# Patient Record
Sex: Male | Born: 1970 | Race: White | Hispanic: No | Smoking: Former smoker
Health system: Southern US, Community
[De-identification: ages and names within clinical notes are randomized; demographics above are authoritative.]

## PROBLEM LIST (undated history)

## (undated) DIAGNOSIS — K219 Gastro-esophageal reflux disease without esophagitis: Secondary | ICD-10-CM

## (undated) DIAGNOSIS — J45909 Unspecified asthma, uncomplicated: Secondary | ICD-10-CM

## (undated) HISTORY — DX: Unspecified asthma, uncomplicated: J45.909

## (undated) HISTORY — DX: Gastro-esophageal reflux disease without esophagitis: K21.9

## (undated) HISTORY — PX: OTHER SURGICAL HISTORY: SHX169

---

## 2009-04-18 ENCOUNTER — Emergency Department (HOSPITAL_COMMUNITY): Admission: EM | Admit: 2009-04-18 | Discharge: 2009-04-18 | Payer: Self-pay | Admitting: *Deleted

## 2012-05-30 ENCOUNTER — Emergency Department (HOSPITAL_BASED_OUTPATIENT_CLINIC_OR_DEPARTMENT_OTHER)
Admission: EM | Admit: 2012-05-30 | Discharge: 2012-05-30 | Disposition: A | Discharge status: Home / Self Care | Payer: BC Managed Care – PPO | Attending: Emergency Medicine | Admitting: Emergency Medicine

## 2012-05-30 ENCOUNTER — Encounter (HOSPITAL_BASED_OUTPATIENT_CLINIC_OR_DEPARTMENT_OTHER): Payer: Self-pay | Admitting: *Deleted

## 2012-05-30 DIAGNOSIS — X12XXXA Contact with other hot fluids, initial encounter: Secondary | ICD-10-CM | POA: Insufficient documentation

## 2012-05-30 DIAGNOSIS — Z880 Allergy status to penicillin: Secondary | ICD-10-CM | POA: Insufficient documentation

## 2012-05-30 DIAGNOSIS — T23202A Burn of second degree of left hand, unspecified site, initial encounter: Secondary | ICD-10-CM

## 2012-05-30 DIAGNOSIS — T2112XA Burn of first degree of abdominal wall, initial encounter: Secondary | ICD-10-CM | POA: Insufficient documentation

## 2012-05-30 DIAGNOSIS — T23209A Burn of second degree of unspecified hand, unspecified site, initial encounter: Secondary | ICD-10-CM | POA: Insufficient documentation

## 2012-05-30 DIAGNOSIS — F17200 Nicotine dependence, unspecified, uncomplicated: Secondary | ICD-10-CM | POA: Insufficient documentation

## 2012-05-30 MED ORDER — OXYCODONE-ACETAMINOPHEN 5-325 MG PO TABS: 2.0000 | ORAL_TABLET | Freq: Four times a day (QID) | ORAL | Status: AC | PRN

## 2012-05-30 MED ORDER — OXYCODONE-ACETAMINOPHEN 5-325 MG PO TABS: 2.0000 | ORAL_TABLET | Freq: Once | ORAL | Status: DC

## 2012-05-30 MED ORDER — MORPHINE SULFATE 4 MG/ML IJ SOLN
4.0000 mg | Freq: Once | INTRAMUSCULAR | Status: DC
  Filled 2012-05-30: qty 1

## 2012-05-30 MED ORDER — SILVER SULFADIAZINE 1 % EX CREA: TOPICAL_CREAM | Freq: Two times a day (BID) | CUTANEOUS | Status: AC

## 2012-05-30 MED ORDER — MORPHINE SULFATE 4 MG/ML IJ SOLN
4.0000 mg | Freq: Once | INTRAMUSCULAR | Status: AC
  Administered 2012-05-30: 4 mg via INTRAMUSCULAR

## 2012-05-30 MED ORDER — MORPHINE SULFATE 4 MG/ML IJ SOLN
4.0000 mg | Freq: Once | INTRAMUSCULAR | Status: AC
  Administered 2012-05-30: 4 mg via INTRAMUSCULAR
  Filled 2012-05-30: qty 1

## 2012-05-30 MED ORDER — SILVER SULFADIAZINE 1 % EX CREA
TOPICAL_CREAM | CUTANEOUS | Status: AC
  Administered 2012-05-30: 22:00:00
  Filled 2012-05-30: qty 85

## 2012-05-30 MED ORDER — OXYCODONE-ACETAMINOPHEN 5-325 MG PO TABS
2.0000 | ORAL_TABLET | Freq: Once | ORAL | Status: AC
  Administered 2012-05-30: 2 via ORAL
  Filled 2012-05-30: qty 2

## 2012-05-30 MED ORDER — LIDOCAINE HCL 2 % EX GEL
Freq: Once | CUTANEOUS | Status: DC
  Filled 2012-05-30: qty 20

## 2012-05-30 NOTE — ED Provider Notes (Signed)
I saw and evaluated the patient, reviewed the resident's note and I agree with the findings and plan.   .Face to face Exam:  General:  Awake HEENT:  Atraumatic Resp:  Normal effort Abd:  Nondistended Neuro:No focal weakness Lymph: No adenopathy   Nelia Shi, MD 05/30/12 2203
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
## 2012-06-01 ENCOUNTER — Encounter (HOSPITAL_BASED_OUTPATIENT_CLINIC_OR_DEPARTMENT_OTHER): Payer: BC Managed Care – PPO | Attending: General Surgery

## 2012-06-01 DIAGNOSIS — IMO0002 Reserved for concepts with insufficient information to code with codable children: Secondary | ICD-10-CM | POA: Insufficient documentation

## 2012-06-01 DIAGNOSIS — T310 Burns involving less than 10% of body surface: Secondary | ICD-10-CM | POA: Insufficient documentation

## 2012-06-01 DIAGNOSIS — T23039A Burn of unspecified degree of unspecified multiple fingers (nail), not including thumb, initial encounter: Secondary | ICD-10-CM | POA: Insufficient documentation
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
## 2012-06-08 ENCOUNTER — Encounter (HOSPITAL_BASED_OUTPATIENT_CLINIC_OR_DEPARTMENT_OTHER): Payer: BC Managed Care – PPO

## 2012-06-29 ENCOUNTER — Encounter (HOSPITAL_BASED_OUTPATIENT_CLINIC_OR_DEPARTMENT_OTHER): Payer: BC Managed Care – PPO

## 2017-04-06 ENCOUNTER — Other Ambulatory Visit: Payer: Self-pay | Admitting: Medical

## 2017-04-06 DIAGNOSIS — R1011 Right upper quadrant pain: Secondary | ICD-10-CM

## 2017-04-13 ENCOUNTER — Ambulatory Visit
Admission: RE | Admit: 2017-04-13 | Discharge: 2017-04-13 | Disposition: A | Discharge status: Home / Self Care | Payer: No Typology Code available for payment source | Source: Ambulatory Visit | Attending: Medical | Admitting: Medical

## 2017-04-13 DIAGNOSIS — R1011 Right upper quadrant pain: Secondary | ICD-10-CM

## 2019-08-26 ENCOUNTER — Encounter (HOSPITAL_BASED_OUTPATIENT_CLINIC_OR_DEPARTMENT_OTHER): Payer: Self-pay | Admitting: *Deleted

## 2019-08-26 ENCOUNTER — Other Ambulatory Visit: Payer: Self-pay

## 2019-08-26 ENCOUNTER — Emergency Department (HOSPITAL_BASED_OUTPATIENT_CLINIC_OR_DEPARTMENT_OTHER): Payer: Self-pay

## 2019-08-26 ENCOUNTER — Emergency Department (HOSPITAL_BASED_OUTPATIENT_CLINIC_OR_DEPARTMENT_OTHER)
Admission: EM | Admit: 2019-08-26 | Discharge: 2019-08-26 | Disposition: A | Discharge status: Home / Self Care | Payer: Self-pay | Attending: Emergency Medicine | Admitting: Emergency Medicine

## 2019-08-26 DIAGNOSIS — F17210 Nicotine dependence, cigarettes, uncomplicated: Secondary | ICD-10-CM | POA: Insufficient documentation

## 2019-08-26 DIAGNOSIS — W050XXA Fall from non-moving wheelchair, initial encounter: Secondary | ICD-10-CM | POA: Insufficient documentation

## 2019-08-26 DIAGNOSIS — Y999 Unspecified external cause status: Secondary | ICD-10-CM | POA: Insufficient documentation

## 2019-08-26 DIAGNOSIS — Y929 Unspecified place or not applicable: Secondary | ICD-10-CM | POA: Insufficient documentation

## 2019-08-26 DIAGNOSIS — S2241XA Multiple fractures of ribs, right side, initial encounter for closed fracture: Secondary | ICD-10-CM | POA: Insufficient documentation

## 2019-08-26 DIAGNOSIS — Y939 Activity, unspecified: Secondary | ICD-10-CM | POA: Insufficient documentation

## 2019-08-26 MED ORDER — OXYCODONE-ACETAMINOPHEN 5-325 MG PO TABS
1.0000 | ORAL_TABLET | Freq: Once | ORAL | Status: AC
  Administered 2019-08-26: 1 via ORAL
  Filled 2019-08-26: qty 1

## 2019-08-26 MED ORDER — OXYCODONE-ACETAMINOPHEN 5-325 MG PO TABS: 1.0000 | ORAL_TABLET | Freq: Three times a day (TID) | ORAL | 0 refills | Status: DC | PRN

## 2019-08-26 NOTE — Discharge Instructions (Signed)
It is important for you to have your pain control and use incentive spirometry as directed. Follow-up with your primary care provider. Return to the ED if you start to develop worsening chest pain, shortness of breath, leg swelling, fever.
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
## 2020-01-10 ENCOUNTER — Other Ambulatory Visit: Payer: Self-pay

## 2020-01-11 MED ORDER — LANSOPRAZOLE 30 MG PO CPDR: 30.0000 mg | DELAYED_RELEASE_CAPSULE | Freq: Every day | ORAL | 0 refills | Status: DC

## 2020-01-16 ENCOUNTER — Ambulatory Visit (INDEPENDENT_AMBULATORY_CARE_PROVIDER_SITE_OTHER): Payer: Self-pay | Admitting: Physician Assistant

## 2020-01-16 ENCOUNTER — Encounter: Payer: Self-pay | Admitting: Physician Assistant

## 2020-01-16 ENCOUNTER — Other Ambulatory Visit: Payer: Self-pay

## 2020-01-16 VITALS — BP 128/72 | HR 80 | Temp 98.2°F | Resp 18 | Ht 71.0 in | Wt 251.0 lb

## 2020-01-16 DIAGNOSIS — K219 Gastro-esophageal reflux disease without esophagitis: Secondary | ICD-10-CM | POA: Insufficient documentation

## 2020-01-16 MED ORDER — LANSOPRAZOLE 30 MG PO CPDR: 30.0000 mg | DELAYED_RELEASE_CAPSULE | Freq: Every day | ORAL | 11 refills | Status: DC

## 2020-01-16 NOTE — Progress Notes (Signed)
Acute Office Visit  Subjective:    Patient ID: John Valencia, male    DOB: January 17, 1971, 49 y.o.   MRN: [id number]  Chief Complaint  Patient presents with  . Gastroesophageal Reflux    Follow up for refill meds    HPI Patient is in today for follow up GERD Pt has been on prevacid 30mg  qd for several years - he has tried many medications in the past with minimal to no relief and this one takes care of his symptoms completely  He requests refill He denies any other problems today -- have recommended physical and labwork but pt defers due to financial reasons  Past Medical History:  Diagnosis Date  . Asthma   . GERD (gastroesophageal reflux disease)     Past Surgical History:  Procedure Laterality Date  . gerd      Family History  Problem Relation Age of Onset  . Cerebrovascular Accident Maternal Grandmother   . Diabetes type II Other   . Cancer Other     Social History   Socioeconomic History  . Marital status: Married    Spouse name: Not on file  . Number of children: 3  . Years of education: Not on file  . Highest education level: Not on file  Occupational History  . Occupation: Self employed  Tobacco Use  . Smoking status: Former Smoker    Packs/day: 1.00  . Smokeless tobacco: Never Used  Substance and Sexual Activity  . Alcohol use: No  . Drug use: No  . Sexual activity: Never  Other Topics Concern  . Not on file  Social History Narrative  . Not on file   Social Determinants of Health   Financial Resource Strain:   . Difficulty of Paying Living Expenses:   Food Insecurity:   . Worried About Medical laboratory scientific officer in the Last Year:   . Programme researcher, broadcasting/film/video in the Last Year:   Transportation Needs:   . Barista (Medical):   Freight forwarder Lack of Transportation (Non-Medical):   Physical Activity:   . Days of Exercise per Week:   . Minutes of Exercise per Session:   Stress:   . Feeling of Stress :   Social Connections:   . Frequency of  Communication with Friends and Family:   . Frequency of Social Gatherings with Friends and Family:   . Attends Religious Services:   . Active Member of Clubs or Organizations:   . Attends Marland Kitchen Meetings:   Banker Marital Status:   Intimate Partner Violence:   . Fear of Current or Ex-Partner:   . Emotionally Abused:   Marland Kitchen Physically Abused:   . Sexually Abused:      Current Outpatient Medications:  .  Aspirin-Acetaminophen-Caffeine (EXCEDRIN MIGRAINE PO), Take by mouth., Disp: , Rfl:  .  lansoprazole (PREVACID) 30 MG capsule, Take 1 capsule (30 mg total) by mouth daily., Disp: 301 capsule, Rfl: 11   Allergies  Allergen Reactions  . Penicillins Anaphylaxis    CONSTITUTIONAL: Negative for chills, fatigue, fever, unintentional weight gain and unintentional weight loss.  E/N/T: Negative for ear pain, nasal congestion and sore throat.  CARDIOVASCULAR: Negative for chest pain, dizziness, palpitations and pedal edema.  RESPIRATORY: Negative for recent cough and dyspnea.  GASTROINTESTINAL: Negative for abdominal pain, acid reflux symptoms, constipation, diarrhea, nausea and vomiting.  MSK: Negative for arthralgias and myalgias.  INTEGUMENTARY: Negative for rash.  NEUROLOGICAL: Negative for dizziness and headaches.  PSYCHIATRIC: Negative for  sleep disturbance and to question depression screen.  Negative for depression, negative for anhedonia.         Objective:    PHYSICAL EXAM:   VS: BP 128/72 (BP Location: Left Arm, Patient Position: Sitting)   Pulse 80   Temp 98.2 F (36.8 C) (Temporal)   Resp 18   Ht 5\' 11"  (1.803 m)   Wt 251 lb (113.9 kg)   BMI 35.01 kg/m   GEN: Well nourished, well developed, in no acute distress   Oropharynx - normal mucosa, palate, and posterior pharynx  Cardiac: RRR; no murmurs, rubs, or gallops,no edema - no significant varicosities Respiratory:  normal respiratory rate and pattern with no distress - normal breath sounds with no rales,  rhonchi, wheezes or rubs GI: normal bowel sounds, no masses or tenderness  Psych: euthymic mood, appropriate affect and demeanor   Wt Readings from Last 3 Encounters:  01/16/20 251 lb (113.9 kg)  08/26/19 265 lb (120.2 kg)  05/30/12 202 lb (91.6 kg)    Health Maintenance Due  Topic Date Due  . HIV Screening  Never done  . TETANUS/TDAP  Never done  . INFLUENZA VACCINE  Never done    There are no preventive care reminders to display for this patient.        Assessment & Plan:   Problem List Items Addressed This Visit      Digestive   Gastroesophageal reflux disease without esophagitis - Primary    Continue prevacid as directed Recommend pt come in for physical and labwork      Relevant Medications   lansoprazole (PREVACID) 30 MG capsule       Meds ordered this encounter  Medications  . lansoprazole (PREVACID) 30 MG capsule    Sig: Take 1 capsule (30 mg total) by mouth daily.    Dispense:  301 capsule    Refill:  11    Order Specific Question:   Supervising Provider    Answer:   COX, KIRSTEN [[id number]]     [place], PA-C

## 2020-01-16 NOTE — Assessment & Plan Note (Signed)
Continue prevacid as directed Recommend pt come in for physical and labwork

## 2020-03-13 LAB — COMPREHENSIVE METABOLIC PANEL
Albumin: 4.2 (ref 3.5–5.0)
Calcium: 8.9 (ref 8.7–10.7)
GFR calc Af Amer: 95
GFR calc non Af Amer: 82
Globulin: 2.3

## 2020-03-13 LAB — BASIC METABOLIC PANEL
BUN: 11 (ref 4–21)
CO2: 26 — AB (ref 13–22)
Chloride: 103 (ref 99–108)
Creatinine: 1.1 (ref 0.6–1.3)
Glucose: 135
Potassium: 4.2 (ref 3.4–5.3)
Sodium: 141 (ref 137–147)

## 2020-03-13 LAB — HEPATIC FUNCTION PANEL
ALT: 96 — AB (ref 10–40)
AST: 43 — AB (ref 14–40)
Alkaline Phosphatase: 54 (ref 25–125)
Bilirubin, Total: 0.4

## 2020-04-13 ENCOUNTER — Other Ambulatory Visit: Payer: Self-pay

## 2020-04-13 MED ORDER — LANSOPRAZOLE 30 MG PO CPDR: 30.0000 mg | DELAYED_RELEASE_CAPSULE | Freq: Every day | ORAL | 1 refills | Status: DC

## 2020-09-21 ENCOUNTER — Other Ambulatory Visit: Payer: Self-pay

## 2020-09-21 ENCOUNTER — Ambulatory Visit (INDEPENDENT_AMBULATORY_CARE_PROVIDER_SITE_OTHER): Payer: Self-pay | Admitting: Family Medicine

## 2020-09-21 ENCOUNTER — Encounter: Payer: Self-pay | Admitting: Family Medicine

## 2020-09-21 VITALS — BP 128/76 | HR 63 | Temp 97.7°F | Ht 71.0 in | Wt 237.2 lb

## 2020-09-21 DIAGNOSIS — M79605 Pain in left leg: Secondary | ICD-10-CM

## 2020-09-21 DIAGNOSIS — K219 Gastro-esophageal reflux disease without esophagitis: Secondary | ICD-10-CM

## 2020-09-21 DIAGNOSIS — R0789 Other chest pain: Secondary | ICD-10-CM | POA: Insufficient documentation

## 2020-09-21 DIAGNOSIS — M79604 Pain in right leg: Secondary | ICD-10-CM | POA: Insufficient documentation

## 2020-09-21 DIAGNOSIS — R7309 Other abnormal glucose: Secondary | ICD-10-CM | POA: Insufficient documentation

## 2020-09-21 NOTE — Progress Notes (Signed)
Established Patient Office Visit  Subjective:  Patient ID: John Valencia, male    DOB: [birth year]  Age: 49 y.o. MRN: [id number]  CC:  Chest pain  Leg pain bilat-spasms HPI John Valencia presents for chest pain-right upper quad pain, left upper quad pain-better with stretching. Gallbladder u/s-no concerns-sluge. Pt states leg cramps-"always been strong" calf pain-bilat caused pt and family concern. pts wife died recently of COVID pneumonia. Pt is a truck driver and rarely drinks water. Pt states he does not eat while driving except chips as large amounts of food makes him sleepy. Pt reports on Sat-deer hunting. Onset of leg cramps. Pt states it was cold outside but he often hunts in cooler weather and knows how to layer.  Pt drinks coffee/soda-rarely a soda.  Tea-1/2 gallon /day.  Pt goes deer hunting every weekend.  pts kids concerned about his health. Difficult time due to death of mother.    GERD-prevaid/pepcid prevaid-works on pt GERD-takes daily  Asthma-no inhaler on a regular basis-trigger exercise-no use of inhaler over the past few yearls. Pt quit smoking 3 years ago 11/2017- quit smoking-no meds.   Leg pain-bilat noted with deer hunting described as spasms with flex/extend. Pt states no swelling, no color change noted on the legs. Pt with no h/o DVT or PE. Pt reports walking to deer stand. Stays in a camper as basecamp. Pt admits little water or food on day prior to spasm. Legs yesterday achy but no spasm. Improved today  Stress test-6-7 years ago-treadmill -difficulty due to smoking with SOB, no CP  Pt not willing to check stool or complete colonoscopy   Past Medical History:  Diagnosis Date  . Asthma   . GERD (gastroesophageal reflux disease)     Past Surgical History:  Procedure Laterality Date  . gerd      Family History  Problem Relation Age of Onset  . Cerebrovascular Accident Maternal Grandmother   . Diabetes type II Other   . Cancer Other     Social  History   Socioeconomic History  . Marital status: Married    Spouse name: Not on file  . Number of children: 3  . Years of education: Not on file  . Highest education level: Not on file  Occupational History  . Occupation: Self employedPatent attorney  Tobacco Use  . Smoking status: Former Smoker    Packs/day: 1.00  . Smokeless tobacco: Never Used  Vaping Use  . Vaping Use: Never used  Substance and Sexual Activity  . Alcohol use: No  . Drug use: No  . Sexual activity: Never  Other Topics Concern  . Not on file  Social History Narrative  . Not on file   Social Determinants of Health   Financial Resource Strain:   . Difficulty of Paying Living Expenses: Not on file  Food Insecurity:   . Worried About Charity fundraiser in the Last Year: Not on file  . Ran Out of Food in the Last Year: Not on file  Transportation Needs:   . Lack of Transportation (Medical): Not on file  . Lack of Transportation (Non-Medical): Not on file  Physical Activity:   . Days of Exercise per Week: Not on file  . Minutes of Exercise per Session: Not on file  Stress:   . Feeling of Stress : Not on file  Social Connections:   . Frequency of Communication with Friends and Family: Not on file  . Frequency of Social Gatherings with Friends  and Family: Not on file  . Attends Religious Services: Not on file  . Active Member of Clubs or Organizations: Not on file  . Attends Archivist Meetings: Not on file  . Marital Status: Not on file  Intimate Partner Violence:   . Fear of Current or Ex-Partner: Not on file  . Emotionally Abused: Not on file  . Physically Abused: Not on file  . Sexually Abused: Not on file    Outpatient Medications Prior to Visit  Medication Sig Dispense Refill  . Aspirin-Acetaminophen-Caffeine (EXCEDRIN MIGRAINE PO) Take by mouth.    . lansoprazole (PREVACID) 30 MG capsule Take 1 capsule (30 mg total) by mouth daily. 90 capsule 1   No facility-administered medications  prior to visit.    Allergies  Allergen Reactions  . Penicillins Anaphylaxis    ROS Review of Systems  Constitutional: Negative for fatigue and fever.  Respiratory: Negative for cough, chest tightness and shortness of breath.   Cardiovascular: Positive for chest pain.  Gastrointestinal: Positive for abdominal pain.  Endocrine:       Elevated glucose  Musculoskeletal:       Leg spasms  Skin: Negative for color change and rash.      Objective:    Physical Exam Vitals reviewed.  Constitutional:      Appearance: Normal appearance.  HENT:     Head: Normocephalic and atraumatic.  Cardiovascular:     Rate and Rhythm: Normal rate and regular rhythm.     Pulses: Normal pulses.     Heart sounds: Normal heart sounds.  Pulmonary:     Effort: Pulmonary effort is normal.     Breath sounds: Normal breath sounds.  Abdominal:     Palpations: Abdomen is soft.  Musculoskeletal:        General: No swelling or tenderness.     Cervical back: Normal range of motion and neck supple.     Right lower leg: No edema.     Left lower leg: No edema.     Comments: No asymmetry to lower legs when measured  Skin:    General: Skin is warm.     Findings: No bruising or erythema.  Neurological:     Mental Status: He is alert and oriented to person, place, and time.  Psychiatric:        Mood and Affect: Mood normal.        Behavior: Behavior normal.     BP 128/76 (BP Location: Left Arm, Patient Position: Sitting, Cuff Size: Normal)   Pulse 63   Temp 97.7 F (36.5 C) (Temporal)   Ht _0  (1.803 m)   Wt 237 lb 3.2 oz (107.6 kg)   SpO2 96%   BMI 33.08 kg/m  Wt Readings from Last 3 Encounters:  09/21/20 237 lb 3.2 oz (107.6 kg)  01/16/20 251 lb (113.9 kg)  08/26/19 265 lb (120.2 kg)  weight loss-220lbs avg-when pt quit smoking-gained weight. Pt cooked Thanksgiving yesterday.  Pt eats alone. Pt does not eat while he drives. "munch on chips"    Health Maintenance Due  Topic Date Due  .  Hepatitis C Screening  Never done  . COVID-19 Vaccine (1) Never done  . HIV Screening  Never done  . TETANUS/TDAP  Never done  . COLONOSCOPY  Never done   1. Leg pain, bilateral Electrolyte water suggested today. Protein/Carb combo with meals/snacks-concern for elevated glucose on labwork 2019 - CMP14+EGFR - CBC With Differential - Lipid Profile - TSH + free  T4  2. Gastroesophageal reflux disease, unspecified whether esophagitis present Continue prevacid-concern for caffeine and fluid loss-smaller meals + no caffeine fluids to improve hydration - CMP14+EGFR - CBC With Differential - Lipid Profile - TSH + free T4 - CBC with Differential/Platelet - Magnesium 3. Elevated glucose Previously elevated on blood work-pt did not return for follow up - Hgb A1C w/o eAG  4. Other chest pain ecg normal-prior stress test. Bp, pulse rate and ecg normal. D/w pt if worsening symptoms, recurrent symptoms or other concerns ER for evaluation.  Follow-up labwork  Corderius Saraceni Hannah Beat, MD

## 2020-09-21 NOTE — Patient Instructions (Signed)
Leg Cramps Leg cramps occur when one or more muscles tighten and you have no control over this tightening (involuntary muscle contraction). Muscle cramps can develop in any muscle, but the most common place is in the calf muscles of the leg. Those cramps can occur during exercise or when you are at rest. Leg cramps are painful, and they may last for a few seconds to a few minutes. Cramps may return several times before they finally stop. Usually, leg cramps are not caused by a serious medical problem. In many cases, the cause is not known. Some common causes include:  Excessive physical effort (overexertion), such as during intense exercise.  Overuse from repetitive motions, or doing the same thing over and over.  Staying in a certain position for a long period of time.  Improper preparation, form, or technique while performing a sport or an activity.  Dehydration.  Injury.  Side effects of certain medicines.  Abnormally low levels of minerals in your blood (electrolytes), especially potassium and calcium. This could result from: ? Pregnancy. ? Taking diuretic medicines. Follow these instructions at home: Eating and drinking  Drink enough fluid to keep your urine pale yellow. Staying hydrated may help prevent cramps.  Eat a healthy diet that includes plenty of nutrients to help your muscles function. A healthy diet includes fruits and vegetables, lean protein, whole grains, and low-fat or nonfat dairy products. Managing pain, stiffness, and swelling      Try massaging, stretching, and relaxing the affected muscle. Do this for several minutes at a time.  If directed, put ice on areas that are sore or painful after a cramp: ? Put ice in a plastic bag. ? Place a towel between your skin and the bag. ? Leave the ice on for 20 minutes, 2-3 times a day.  If directed, apply heat to muscles that are tense or tight. Do this before you exercise, or as often as told by your health care  provider. Use the heat source that your health care provider recommends, such as a moist heat pack or a heating pad. ? Place a towel between your skin and the heat source. ? Leave the heat on for 20-30 minutes. ? Remove the heat if your skin turns bright red. This is especially important if you are unable to feel pain, heat, or cold. You may have a greater risk of getting burned.  Try taking hot showers or baths to help relax tight muscles. General instructions  If you are having frequent leg cramps, avoid intense exercise for several days.  Take over-the-counter and prescription medicines only as told by your health care provider.  Keep all follow-up visits as told by your health care provider. This is important. Contact a health care provider if:  Your leg cramps get more severe or more frequent, or they do not improve over time.  Your foot becomes cold, numb, or blue. Summary  Muscle cramps can develop in any muscle, but the most common place is in the calf muscles of the leg.  Leg cramps are painful, and they may last for a few seconds to a few minutes.  Usually, leg cramps are not caused by a serious medical problem. Often, the cause is not known.  Stay hydrated and take over-the-counter and prescription medicines only as told by your health care provider. This information is not intended to replace advice given to you by your health care provider. Make sure you discuss any questions you have with your health care   provider. Document Revised: 10/06/2017 Document Reviewed: 08/03/2017 Elsevier Patient Education  2020 Elsevier Inc.  

## 2020-09-22 ENCOUNTER — Other Ambulatory Visit: Payer: Self-pay

## 2020-09-22 ENCOUNTER — Encounter: Payer: Self-pay | Admitting: Family Medicine

## 2020-09-22 LAB — CMP14+EGFR
ALT: 73 IU/L — ABNORMAL HIGH (ref 0–44)
AST: 43 IU/L — ABNORMAL HIGH (ref 0–40)
Albumin/Globulin Ratio: 1.8 (ref 1.2–2.2)
Albumin: 4.3 g/dL (ref 4.0–5.0)
Alkaline Phosphatase: 76 IU/L (ref 44–121)
BUN/Creatinine Ratio: 10 (ref 9–20)
BUN: 10 mg/dL (ref 6–24)
Bilirubin Total: 0.4 mg/dL (ref 0.0–1.2)
CO2: 22 mmol/L (ref 20–29)
Calcium: 9 mg/dL (ref 8.7–10.2)
Chloride: 98 mmol/L (ref 96–106)
Creatinine, Ser: 0.98 mg/dL (ref 0.76–1.27)
GFR calc Af Amer: 103 mL/min/{1.73_m2} (ref >59)
GFR calc non Af Amer: 90 mL/min/{1.73_m2} (ref >59)
Globulin, Total: 2.4 g/dL (ref 1.5–4.5)
Glucose: 269 mg/dL — ABNORMAL HIGH (ref 65–99)
Potassium: 4.4 mmol/L (ref 3.5–5.2)
Sodium: 133 mmol/L — ABNORMAL LOW (ref 134–144)
Total Protein: 6.7 g/dL (ref 6.0–8.5)

## 2020-09-22 LAB — TSH+FREE T4
Free T4: 0.85 ng/dL (ref 0.82–1.77)
TSH: 1.96 u[IU]/mL (ref 0.450–4.500)

## 2020-09-22 LAB — HGB A1C W/O EAG: Hgb A1c MFr Bld: 12 % — ABNORMAL HIGH (ref 4.8–5.6)

## 2020-09-22 LAB — CARDIOVASCULAR RISK ASSESSMENT

## 2020-09-22 LAB — CBC WITH DIFFERENTIAL
Basophils Absolute: 0.1 10*3/uL (ref 0.0–0.2)
Basos: 1 %
EOS (ABSOLUTE): 0.1 10*3/uL (ref 0.0–0.4)
Eos: 2 %
Hematocrit: 42.6 % (ref 37.5–51.0)
Hemoglobin: 14.4 g/dL (ref 13.0–17.7)
Immature Grans (Abs): 0 10*3/uL (ref 0.0–0.1)
Immature Granulocytes: 0 %
Lymphocytes Absolute: 3.3 10*3/uL — ABNORMAL HIGH (ref 0.7–3.1)
Lymphs: 48 %
MCH: 29.2 pg (ref 26.6–33.0)
MCHC: 33.8 g/dL (ref 31.5–35.7)
MCV: 86 fL (ref 79–97)
Monocytes Absolute: 0.3 10*3/uL (ref 0.1–0.9)
Monocytes: 5 %
Neutrophils Absolute: 3 10*3/uL (ref 1.4–7.0)
Neutrophils: 44 %
RBC: 4.93 x10E6/uL (ref 4.14–5.80)
RDW: 12.6 % (ref 11.6–15.4)
WBC: 6.8 10*3/uL (ref 3.4–10.8)

## 2020-09-22 LAB — LIPID PANEL
Chol/HDL Ratio: 8 ratio — ABNORMAL HIGH (ref 0.0–5.0)
Cholesterol, Total: 216 mg/dL — ABNORMAL HIGH (ref 100–199)
HDL: 27 mg/dL — ABNORMAL LOW (ref >39)
LDL Chol Calc (NIH): 132 mg/dL — ABNORMAL HIGH (ref 0–99)
Triglycerides: 313 mg/dL — ABNORMAL HIGH (ref 0–149)
VLDL Cholesterol Cal: 57 mg/dL — ABNORMAL HIGH (ref 5–40)

## 2020-10-06 ENCOUNTER — Encounter: Payer: Self-pay | Admitting: Physician Assistant

## 2020-10-06 ENCOUNTER — Other Ambulatory Visit: Payer: Self-pay

## 2020-10-06 ENCOUNTER — Ambulatory Visit (INDEPENDENT_AMBULATORY_CARE_PROVIDER_SITE_OTHER): Payer: Self-pay | Admitting: Physician Assistant

## 2020-10-06 VITALS — BP 120/74 | HR 74 | Temp 97.9°F | Ht 71.0 in | Wt 240.0 lb

## 2020-10-06 DIAGNOSIS — E1165 Type 2 diabetes mellitus with hyperglycemia: Secondary | ICD-10-CM

## 2020-10-06 DIAGNOSIS — E782 Mixed hyperlipidemia: Secondary | ICD-10-CM | POA: Insufficient documentation

## 2020-10-06 MED ORDER — METFORMIN HCL 500 MG PO TABS: 500.0000 mg | ORAL_TABLET | Freq: Two times a day (BID) | ORAL | 2 refills | Status: DC

## 2020-10-06 MED ORDER — LANSOPRAZOLE 30 MG PO CPDR: 30.0000 mg | DELAYED_RELEASE_CAPSULE | Freq: Every day | ORAL | 3 refills | Status: DC

## 2020-10-06 NOTE — Progress Notes (Signed)
Acute Office Visit  Subjective:    Patient ID: John Valencia, male    DOB: 1971-01-25, 49 y.o.   MRN: [id number]  Chief Complaint  Patient presents with  . Elevated A1c    follow up    HPI Patient is in today for follow up new onset diabetes Pt had labwork about 2 weeks ago and was found to have elevated glucose and hgb a1c of 12 - pt adamant that he is going to work on his diet and does not want to take any type of medications at this time however it is explained he needed to start medication to control glucose and is agreeable to try glucophage His lipid panel was elevated as well but refuses to take medication for that at this point - is going to work on diet  Past Medical History:  Diagnosis Date  . Asthma   . GERD (gastroesophageal reflux disease)     Past Surgical History:  Procedure Laterality Date  . gerd      Family History  Problem Relation Age of Onset  . Cerebrovascular Accident Maternal Grandmother   . Diabetes type II Other   . Cancer Other     Social History   Socioeconomic History  . Marital status: Married    Spouse name: Not on file  . Number of children: 3  . Years of education: Not on file  . Highest education level: Not on file  Occupational History  . Occupation: Self employedMedical laboratory scientific officer  Tobacco Use  . Smoking status: Former Smoker    Packs/day: 1.00  . Smokeless tobacco: Never Used  Vaping Use  . Vaping Use: Never used  Substance and Sexual Activity  . Alcohol use: No  . Drug use: No  . Sexual activity: Never  Other Topics Concern  . Not on file  Social History Narrative  . Not on file   Social Determinants of Health   Financial Resource Strain:   . Difficulty of Paying Living Expenses: Not on file  Food Insecurity:   . Worried About Programme researcher, broadcasting/film/video in the Last Year: Not on file  . Ran Out of Food in the Last Year: Not on file  Transportation Needs:   . Lack of Transportation (Medical): Not on file  . Lack of  Transportation (Non-Medical): Not on file  Physical Activity:   . Days of Exercise per Week: Not on file  . Minutes of Exercise per Session: Not on file  Stress:   . Feeling of Stress : Not on file  Social Connections:   . Frequency of Communication with Friends and Family: Not on file  . Frequency of Social Gatherings with Friends and Family: Not on file  . Attends Religious Services: Not on file  . Active Member of Clubs or Organizations: Not on file  . Attends Banker Meetings: Not on file  . Marital Status: Not on file  Intimate Partner Violence:   . Fear of Current or Ex-Partner: Not on file  . Emotionally Abused: Not on file  . Physically Abused: Not on file  . Sexually Abused: Not on file     Current Outpatient Medications:  .  Aspirin-Acetaminophen-Caffeine (EXCEDRIN MIGRAINE PO), Take by mouth., Disp: , Rfl:  .  lansoprazole (PREVACID) 30 MG capsule, Take 1 capsule (30 mg total) by mouth daily., Disp: 90 capsule, Rfl: 3 .  metFORMIN (GLUCOPHAGE) 500 MG tablet, Take 1 tablet (500 mg total) by mouth 2 (two) times daily  with a meal., Disp: 60 tablet, Rfl: 2   Allergies  Allergen Reactions  . Penicillins Anaphylaxis   CONSTITUTIONAL: Negative for chills, fatigue, fever, unintentional weight gain and unintentional weight loss.  E/N/T: Negative for ear pain, nasal congestion and sore throat.  CARDIOVASCULAR: Negative for chest pain, dizziness, palpitations and pedal edema.  RESPIRATORY: Negative for recent cough and dyspnea.  GASTROINTESTINAL: Negative for abdominal pain, acid reflux symptoms, constipation, diarrhea, nausea and vomiting.  PSYCHIATRIC: Negative for sleep disturbance and to question depression screen.  Negative for depression, negative for anhedonia.             Objective:    PHYSICAL EXAM:   VS: BP 120/74 (BP Location: Right Arm, Patient Position: Sitting, Cuff Size: Normal)   Pulse 74   Temp 97.9 F (36.6 C) (Temporal)   Ht 5\' 11"   (1.803 m)   Wt 240 lb (108.9 kg)   SpO2 99%   BMI 33.47 kg/m   PHYSICAL EXAM:   VS: BP 120/74 (BP Location: Right Arm, Patient Position: Sitting, Cuff Size: Normal)   Pulse 74   Temp 97.9 F (36.6 C) (Temporal)   Ht 5\' 11"  (1.803 m)   Wt 240 lb (108.9 kg)   SpO2 99%   BMI 33.47 kg/m   GEN: Well nourished, well developed, in no acute distress   Cardiac: RRR; no murmurs, rubs, or gallops,no edema - no significant varicosities Respiratory:  normal respiratory rate and pattern with no distress - normal breath sounds with no rales, rhonchi, wheezes or rubs GI: normal bowel sounds, no masses or tenderness  Psych: euthymic mood, appropriate affect and demeanor    Wt Readings from Last 3 Encounters:  10/06/20 240 lb (108.9 kg)  09/21/20 237 lb 3.2 oz (107.6 kg)  01/16/20 251 lb (113.9 kg)    Health Maintenance Due  Topic Date Due  . Hepatitis C Screening  Never done  . PNEUMOCOCCAL POLYSACCHARIDE VACCINE AGE 10-64 HIGH RISK  Never done  . FOOT EXAM  Never done  . OPHTHALMOLOGY EXAM  Never done  . COVID-19 Vaccine (1) Never done  . HIV Screening  Never done  . TETANUS/TDAP  Never done  . COLONOSCOPY  Never done    There are no preventive care reminders to display for this patient.        Assessment & Plan:   Problem List Items Addressed This Visit      Endocrine   Type 2 diabetes mellitus with hyperglycemia, without long-term current use of insulin (HCC) - Primary   Relevant Medications   metFORMIN (GLUCOPHAGE) 500 MG tablet Continue to watch diet and follow up in 3 months     Other   Mixed hyperlipidemia Pt refuses meds - watch diet       Meds ordered this encounter  Medications  . metFORMIN (GLUCOPHAGE) 500 MG tablet    Sig: Take 1 tablet (500 mg total) by mouth 2 (two) times daily with a meal.    Dispense:  60 tablet    Refill:  2    Order Specific Question:   Supervising Provider    Answer11/17/21  . lansoprazole (PREVACID) 30 MG  capsule    Sig: Take 1 capsule (30 mg total) by mouth daily.    Dispense:  90 capsule    Refill:  3    Order Specific Question:   Supervising Provider    Answer:   COX, KIRSTEN 03/17/20     SARA R Marchello Rothgeb,  PA-C

## 2020-10-14 ENCOUNTER — Encounter: Payer: Self-pay | Admitting: Physician Assistant

## 2021-01-05 ENCOUNTER — Ambulatory Visit (INDEPENDENT_AMBULATORY_CARE_PROVIDER_SITE_OTHER): Payer: Self-pay | Admitting: Physician Assistant

## 2021-01-05 ENCOUNTER — Encounter: Payer: Self-pay | Admitting: Physician Assistant

## 2021-01-05 ENCOUNTER — Ambulatory Visit: Payer: Self-pay | Admitting: Family Medicine

## 2021-01-05 ENCOUNTER — Other Ambulatory Visit: Payer: Self-pay

## 2021-01-05 VITALS — BP 124/82 | HR 72 | Temp 96.8°F | Ht 71.0 in | Wt 246.0 lb

## 2021-01-05 DIAGNOSIS — E782 Mixed hyperlipidemia: Secondary | ICD-10-CM

## 2021-01-05 DIAGNOSIS — E1165 Type 2 diabetes mellitus with hyperglycemia: Secondary | ICD-10-CM

## 2021-01-05 NOTE — Progress Notes (Signed)
Acute Office Visit  Subjective:    Patient ID: John Valencia, male    DOB: 1971/06/15, 50 y.o.   MRN: [id number]  Chief Complaint  Patient presents with  . Diabetes    26M fasting    HPI Patient is in today for follow up new onset diabetes Pt states that he is taking his glucophage 500mg  bid and watching his diet -  He has not been checking glucose -  Still not wanting to take statin or arb Voices no other problems or concerns today  Past Medical History:  Diagnosis Date  . Asthma   . GERD (gastroesophageal reflux disease)     Past Surgical History:  Procedure Laterality Date  . gerd      Family History  Problem Relation Age of Onset  . Cerebrovascular Accident Maternal Grandmother   . Diabetes type II Other   . Cancer Other     Social History   Socioeconomic History  . Marital status: Married    Spouse name: Not on file  . Number of children: 3  . Years of education: Not on file  . Highest education level: Not on file  Occupational History  . Occupation: Self employed  Tobacco Use  . Smoking status: Former Smoker    Packs/day: 1.00  . Smokeless tobacco: Never Used  Vaping Use  . Vaping Use: Never used  Substance and Sexual Activity  . Alcohol use: No  . Drug use: No  . Sexual activity: Never  Other Topics Concern  . Not on file  Social History Narrative  . Not on file   Social Determinants of Health   Financial Resource Strain: Not on file  Food Insecurity: Not on file  Transportation Needs: Not on file  Physical Activity: Not on file  Stress: Not on file  Social Connections: Not on file  Intimate Partner Violence: Not on file     Current Outpatient Medications:  .  Aspirin-Acetaminophen-Caffeine (EXCEDRIN MIGRAINE PO), Take by mouth., Disp: , Rfl:  .  lansoprazole (PREVACID) 30 MG capsule, Take 1 capsule (30 mg total) by mouth daily., Disp: 90 capsule, Rfl: 3 .  metFORMIN (GLUCOPHAGE) 500 MG tablet, Take 1 tablet (500 mg  total) by mouth 2 (two) times daily with a meal., Disp: 60 tablet, Rfl: 2   Allergies  Allergen Reactions  . Penicillins Anaphylaxis   CONSTITUTIONAL: Negative for chills, fatigue, fever, unintentional weight gain and unintentional weight loss.  CARDIOVASCULAR: Negative for chest pain, dizziness, palpitations and pedal edema.  RESPIRATORY: Negative for recent cough and dyspnea.  PSYCHIATRIC: Negative for sleep disturbance and to question depression screen.  Negative for depression, negative for anhedonia.                 Objective:    PHYSICAL EXAM:   VS: BP 124/82 (BP Location: Right Arm, Patient Position: Sitting, Cuff Size: Normal)   Pulse 72   Temp (!) 96.8 F (36 C) (Temporal)   Ht 5\' 11"  (1.803 m)   Wt 246 lb (111.6 kg)   SpO2 97%   BMI 34.31 kg/m   PHYSICAL EXAM:   VS: BP 124/82 (BP Location: Right Arm, Patient Position: Sitting, Cuff Size: Normal)   Pulse 72   Temp (!) 96.8 F (36 C) (Temporal)   Ht 5\' 11"  (1.803 m)   Wt 246 lb (111.6 kg)   SpO2 97%   BMI 34.31 kg/m   PHYSICAL EXAM:   VS: BP 124/82 (BP Location: Right Arm,  Patient Position: Sitting, Cuff Size: Normal)   Pulse 72   Temp (!) 96.8 F (36 C) (Temporal)   Ht 5\' 11"  (1.803 m)   Wt 246 lb (111.6 kg)   SpO2 97%   BMI 34.31 kg/m   GEN: Well nourished, well developed, in no acute distress  Cardiac: RRR; no murmurs, rubs, or gallops,no edema -  Respiratory:  normal respiratory rate and pattern with no distress - normal breath sounds with no rales, rhonchi, wheezes or rubs Psych: euthymic mood, appropriate affect and demeanor    Wt Readings from Last 3 Encounters:  01/05/21 246 lb (111.6 kg)  10/06/20 240 lb (108.9 kg)  09/21/20 237 lb 3.2 oz (107.6 kg)    Health Maintenance Due  Topic Date Due  . Hepatitis C Screening  Never done  . PNEUMOCOCCAL POLYSACCHARIDE VACCINE AGE 41-64 HIGH RISK  Never done  . COVID-19 Vaccine (1) Never done  . OPHTHALMOLOGY EXAM  Never done  . URINE  MICROALBUMIN  Never done  . HIV Screening  Never done  . TETANUS/TDAP  Never done  . COLONOSCOPY (Pts 45-61yrs Insurance coverage will need to be confirmed)  Never done    There are no preventive care reminders to display for this patient.        Assessment & Plan:   Problem List Items Addressed This Visit      Endocrine   Type 2 diabetes mellitus with hyperglycemia, without long-term current use of insulin (HCC) - Primary   Relevant Medications   metFORMIN (GLUCOPHAGE) 500 MG tablet Continue to watch diet and follow up in 3 months     Other   Mixed hyperlipidemia Pt refuses meds - watch diet       No orders of the defined types were placed in this encounter.    SARA R Cobie Marcoux, PA-C

## 2021-01-06 LAB — LIPID PANEL
Chol/HDL Ratio: 5.6 ratio — ABNORMAL HIGH (ref 0.0–5.0)
Cholesterol, Total: 196 mg/dL (ref 100–199)
HDL: 35 mg/dL — ABNORMAL LOW (ref >39)
LDL Chol Calc (NIH): 123 mg/dL — ABNORMAL HIGH (ref 0–99)
Triglycerides: 216 mg/dL — ABNORMAL HIGH (ref 0–149)
VLDL Cholesterol Cal: 38 mg/dL (ref 5–40)

## 2021-01-06 LAB — COMPREHENSIVE METABOLIC PANEL
ALT: 78 IU/L — ABNORMAL HIGH (ref 0–44)
AST: 25 IU/L (ref 0–40)
Albumin/Globulin Ratio: 1.9 (ref 1.2–2.2)
Albumin: 4.9 g/dL (ref 4.0–5.0)
Alkaline Phosphatase: 61 IU/L (ref 44–121)
BUN/Creatinine Ratio: 14 (ref 9–20)
BUN: 15 mg/dL (ref 6–24)
Bilirubin Total: 0.3 mg/dL (ref 0.0–1.2)
CO2: 25 mmol/L (ref 20–29)
Calcium: 9.8 mg/dL (ref 8.7–10.2)
Chloride: 100 mmol/L (ref 96–106)
Creatinine, Ser: 1.05 mg/dL (ref 0.76–1.27)
Globulin, Total: 2.6 g/dL (ref 1.5–4.5)
Glucose: 169 mg/dL — ABNORMAL HIGH (ref 65–99)
Potassium: 5 mmol/L (ref 3.5–5.2)
Sodium: 140 mmol/L (ref 134–144)
Total Protein: 7.5 g/dL (ref 6.0–8.5)
eGFR: 86 mL/min/{1.73_m2} (ref >59)

## 2021-01-06 LAB — HEMOGLOBIN A1C
Est. average glucose Bld gHb Est-mCnc: 197 mg/dL
Hgb A1c MFr Bld: 8.5 % — ABNORMAL HIGH (ref 4.8–5.6)

## 2021-01-06 LAB — CARDIOVASCULAR RISK ASSESSMENT

## 2021-03-08 ENCOUNTER — Other Ambulatory Visit: Payer: Self-pay | Admitting: Physician Assistant

## 2021-04-09 ENCOUNTER — Other Ambulatory Visit: Payer: Self-pay

## 2021-04-09 ENCOUNTER — Ambulatory Visit (INDEPENDENT_AMBULATORY_CARE_PROVIDER_SITE_OTHER): Payer: Self-pay | Admitting: Physician Assistant

## 2021-04-09 ENCOUNTER — Encounter: Payer: Self-pay | Admitting: Physician Assistant

## 2021-04-09 VITALS — BP 128/78 | HR 65 | Temp 96.3°F | Ht 71.0 in | Wt 256.2 lb

## 2021-04-09 DIAGNOSIS — E1165 Type 2 diabetes mellitus with hyperglycemia: Secondary | ICD-10-CM

## 2021-04-09 DIAGNOSIS — E782 Mixed hyperlipidemia: Secondary | ICD-10-CM

## 2021-04-09 NOTE — Progress Notes (Signed)
Subjective:  Patient ID: John Valencia, male    DOB: 07-07-1971  Age: 50 y.o. MRN: [id number]  Chief Complaint  Patient presents with  . Diabetes    37M fasting    HPI  pt with history of diabetes - at last visit he was to increase his glucophage to 1000mg  bid because hgb a1c was 8.5 - however he states today he was only taking 500mg  qd at that time and now has increased to 500mg  bid He does not take his glucose and states 'he won't stick himself' States he is 'going to beat diabetes' by watching his diet however he actually gained 10 pounds since last visit  Pt with known elevated lipids - refuses medication Current Outpatient Medications on File Prior to Visit  Medication Sig Dispense Refill  . Aspirin-Acetaminophen-Caffeine (EXCEDRIN MIGRAINE PO) Take by mouth.    . lansoprazole (PREVACID) 30 MG capsule Take 1 capsule (30 mg total) by mouth daily. 90 capsule 3  . metFORMIN (GLUCOPHAGE) 500 MG tablet TAKE 1 TABLET BY MOUTH TWICE DAILY WITH A MEAL 60 tablet 2   No current facility-administered medications on file prior to visit.   Past Medical History:  Diagnosis Date  . Asthma   . GERD (gastroesophageal reflux disease)    Past Surgical History:  Procedure Laterality Date  . gerd      Family History  Problem Relation Age of Onset  . Cerebrovascular Accident Maternal Grandmother   . Diabetes type II Other   . Cancer Other    Social History   Socioeconomic History  . Marital status: Married    Spouse name: Not on file  . Number of children: 3  . Years of education: Not on file  . Highest education level: Not on file  Occupational History  . Occupation: Self employed  Tobacco Use  . Smoking status: Former Smoker    Packs/day: 1.00  . Smokeless tobacco: Never Used  Vaping Use  . Vaping Use: Never used  Substance and Sexual Activity  . Alcohol use: No  . Drug use: No  . Sexual activity: Never  Other Topics Concern  . Not on file  Social History  Narrative  . Not on file   Social Determinants of Health   Financial Resource Strain: Not on file  Food Insecurity: Not on file  Transportation Needs: Not on file  Physical Activity: Not on file  Stress: Not on file  Social Connections: Not on file    Review of Systems  CONSTITUTIONAL: Negative for chills, fatigue, fever, unintentional weight gain and unintentional weight loss.  E/N/T: Negative for ear pain, nasal congestion and sore throat.  CARDIOVASCULAR: Negative for chest pain, dizziness, palpitations and pedal edema.  RESPIRATORY: Negative for recent cough and dyspnea.  GASTROINTESTINAL: Negative for abdominal pain, acid reflux symptoms, constipation, diarrhea, nausea and vomiting.  MSK: Negative for arthralgias and myalgias.  INTEGUMENTARY: Negative for rash.  NEUROLOGICAL: Negative for dizziness and headaches.  PSYCHIATRIC: Negative for sleep disturbance and to question depression screen.  Negative for depression, negative for anhedonia.      Objective:  BP 128/78 (BP Location: Left Arm, Patient Position: Sitting, Cuff Size: Large)   Pulse 65   Temp (!) 96.3 F (35.7 C) (Temporal)   Ht 5\' 11"  (1.803 m)   Wt 256 lb 3.2 oz (116.2 kg)   SpO2 95%   BMI 35.73 kg/m   BP/Weight 04/09/2021 01/05/2021 10/06/2020  Systolic BP 128 124 120  Diastolic BP 78 82  74  Wt. (Lbs) 256.2 246 240  BMI 35.73 34.31 33.47    Physical Exam PHYSICAL EXAM:   VS: BP 128/78 (BP Location: Left Arm, Patient Position: Sitting, Cuff Size: Large)   Pulse 65   Temp (!) 96.3 F (35.7 C) (Temporal)   Ht 5\' 11"  (1.803 m)   Wt 256 lb 3.2 oz (116.2 kg)   SpO2 95%   BMI 35.73 kg/m   GEN: Well nourished, well developed, in no acute distress  Cardiac: RRR; no murmurs, rubs, or gallops, Respiratory:  normal respiratory rate and pattern with no distress - normal breath sounds with no rales, rhonchi, wheezes or rubs Skin: warm and dry, no rash  Psych: euthymic mood, appropriate affect and  demeanor  Diabetic Foot Exam - Simple   No data filed      Lab Results  Component Value Date   WBC 6.8 09/21/2020   HGB 14.4 09/21/2020   HCT 42.6 09/21/2020   GLUCOSE 169 (H) 01/05/2021   CHOL 196 01/05/2021   TRIG 216 (H) 01/05/2021   HDL 35 (L) 01/05/2021   LDLCALC 123 (H) 01/05/2021   ALT 78 (H) 01/05/2021   AST 25 01/05/2021   NA 140 01/05/2021   K 5.0 01/05/2021   CL 100 01/05/2021   CREATININE 1.05 01/05/2021   BUN 15 01/05/2021   CO2 25 01/05/2021   TSH 1.960 09/21/2020   HGBA1C 8.5 (H) 01/05/2021      Assessment & Plan:   1. Type 2 diabetes mellitus with hyperglycemia, without long-term current use of insulin (HCC) labwork pending - cmp and hgb a1c - continue meds as directed 2. Mixed hyperlipidemia  watch diet Pt refuses med  No orders of the defined types were placed in this encounter.   Orders Placed This Encounter  Procedures  . Comprehensive metabolic panel  . Hemoglobin A1c     Follow-up: Return in about 4 months (around 08/09/2021) for chronic fasting.  An After Visit Summary was printed and given to the patient.  10/09/2021 [facility] [phone]

## 2021-04-10 LAB — COMPREHENSIVE METABOLIC PANEL
ALT: 89 IU/L — ABNORMAL HIGH (ref 0–44)
AST: 38 IU/L (ref 0–40)
Albumin/Globulin Ratio: 1.8 (ref 1.2–2.2)
Albumin: 4.5 g/dL (ref 4.0–5.0)
Alkaline Phosphatase: 56 IU/L (ref 44–121)
BUN/Creatinine Ratio: 14 (ref 9–20)
BUN: 15 mg/dL (ref 6–24)
Bilirubin Total: 0.3 mg/dL (ref 0.0–1.2)
CO2: 24 mmol/L (ref 20–29)
Calcium: 9.6 mg/dL (ref 8.7–10.2)
Chloride: 99 mmol/L (ref 96–106)
Creatinine, Ser: 1.08 mg/dL (ref 0.76–1.27)
Globulin, Total: 2.5 g/dL (ref 1.5–4.5)
Glucose: 177 mg/dL — ABNORMAL HIGH (ref 65–99)
Potassium: 4.5 mmol/L (ref 3.5–5.2)
Sodium: 138 mmol/L (ref 134–144)
Total Protein: 7 g/dL (ref 6.0–8.5)
eGFR: 84 mL/min/{1.73_m2} (ref >59)

## 2021-04-10 LAB — HEMOGLOBIN A1C
Est. average glucose Bld gHb Est-mCnc: 186 mg/dL
Hgb A1c MFr Bld: 8.1 % — ABNORMAL HIGH (ref 4.8–5.6)

## 2021-04-19 ENCOUNTER — Other Ambulatory Visit: Payer: Self-pay | Admitting: Physician Assistant

## 2021-04-19 MED ORDER — METFORMIN HCL 1000 MG PO TABS: 1000.0000 mg | ORAL_TABLET | Freq: Two times a day (BID) | ORAL | 0 refills | Status: DC

## 2021-08-05 ENCOUNTER — Other Ambulatory Visit: Payer: Self-pay

## 2021-08-05 ENCOUNTER — Encounter: Payer: Self-pay | Admitting: Physician Assistant

## 2021-08-05 ENCOUNTER — Ambulatory Visit: Payer: Self-pay | Admitting: Physician Assistant

## 2021-08-05 VITALS — BP 116/72 | HR 60 | Temp 97.4°F | Resp 18 | Ht 72.0 in | Wt 256.8 lb

## 2021-08-05 DIAGNOSIS — E1165 Type 2 diabetes mellitus with hyperglycemia: Secondary | ICD-10-CM

## 2021-08-05 DIAGNOSIS — R42 Dizziness and giddiness: Secondary | ICD-10-CM

## 2021-08-05 LAB — POCT UA - MICROALBUMIN: Microalbumin Ur, POC: 10 mg/L

## 2021-08-05 NOTE — Progress Notes (Signed)
Subjective:  Patient ID: John Valencia, male    DOB: 1970/12/13  Age: 50 y.o. MRN: [id number]  Chief Complaint  Patient presents with   Diabetes    Diabetes   pt with history of diabetes - pt has been noncompliant with meds - he states over the past 2 weeks he had felt some intermittent dizziness and thinks it was due to his sugars dropping however he has not taken his glucose at all He stopped metformin and states since stopping medication he felt better and the dizziness has stopped He does not take his glucose and states 'he won't stick himself' States he is 'going to beat diabetes' by watching his diet however he has not lost any weight Did mention other concerns for dizziness and recommend to get EKG - he denies chest pain or dyspnea and refuses further testing/evaluation Has refused treatment with ARBs/ACEs  Pt with known elevated lipids - refuses medication Current Outpatient Medications on File Prior to Visit  Medication Sig Dispense Refill   Aspirin-Acetaminophen-Caffeine (EXCEDRIN MIGRAINE PO) Take by mouth.     lansoprazole (PREVACID) 30 MG capsule Take 1 capsule (30 mg total) by mouth daily. 90 capsule 3   metFORMIN (GLUCOPHAGE) 1000 MG tablet Take 1 tablet (1,000 mg total) by mouth 2 (two) times daily with a meal. 180 tablet 0   No current facility-administered medications on file prior to visit.   Past Medical History:  Diagnosis Date   Asthma    GERD (gastroesophageal reflux disease)    Past Surgical History:  Procedure Laterality Date   gerd      Family History  Problem Relation Age of Onset   Cerebrovascular Accident Maternal Grandmother    Diabetes type II Other    Cancer Other    Social History   Socioeconomic History   Marital status: Married    Spouse name: Not on file   Number of children: 3   Years of education: Not on file   Highest education level: Not on file  Occupational History   Occupation: Self employed- Curator  Tobacco Use    Smoking status: Former    Packs/day: 1.00    Types: Cigarettes   Smokeless tobacco: Never  Vaping Use   Vaping Use: Never used  Substance and Sexual Activity   Alcohol use: No   Drug use: No   Sexual activity: Never  Other Topics Concern   Not on file  Social History Narrative   Not on file   Social Determinants of Health   Financial Resource Strain: Not on file  Food Insecurity: Not on file  Transportation Needs: Not on file  Physical Activity: Not on file  Stress: Not on file  Social Connections: Not on file    CONSTITUTIONAL: Negative for chills, fatigue, fever, unintentional weight gain and unintentional weight loss.  E/N/T: Negative for ear pain, nasal congestion and sore throat.  CARDIOVASCULAR: denies chest pain/dyspnea RESPIRATORY: Negative for recent cough and dyspnea.  GASTROINTESTINAL: Negative for abdominal pain, acid reflux symptoms, constipation, diarrhea, nausea and vomiting.  INTEGUMENTARY: Negative for rash.  NEUROLOGICAL: see HPI   Objective:  PHYSICAL EXAM:   VS: BP 116/72   Pulse 60   Temp (!) 97.4 F (36.3 C)   Resp 18   Ht 6' (1.829 m)   Wt 256 lb 12.8 oz (116.5 kg)   SpO2 98%   BMI 34.83 kg/m   GEN: Well nourished, well developed, in no acute distress  Cardiac: RRR; no murmurs, rubs,  or gallops,no edema -  Respiratory:  normal respiratory rate and pattern with no distress - normal breath sounds with no rales, rhonchi, wheezes or rubs MS: no deformity or atrophy  Skin: warm and dry, no rash  Psych: euthymic mood, appropriate affect and demeanor  Office Visit on 08/05/2021  Component Date Value Ref Range Status   Microalbumin Ur, POC 08/05/2021 10  mg/L Final   aware    Diabetic Foot Exam - Simple   No data filed      Lab Results  Component Value Date   WBC 6.8 09/21/2020   HGB 14.4 09/21/2020   HCT 42.6 09/21/2020   GLUCOSE 177 (H) 04/09/2021   CHOL 196 01/05/2021   TRIG 216 (H) 01/05/2021   HDL 35 (L) 01/05/2021    LDLCALC 123 (H) 01/05/2021   ALT 89 (H) 04/09/2021   AST 38 04/09/2021   NA 138 04/09/2021   K 4.5 04/09/2021   CL 99 04/09/2021   CREATININE 1.08 04/09/2021   BUN 15 04/09/2021   CO2 24 04/09/2021   TSH 1.960 09/21/2020   HGBA1C 8.1 (H) 04/09/2021   MICROALBUR 10 08/05/2021      Assessment & Plan:   1. Type 2 diabetes mellitus with hyperglycemia, without long-term current use of insulin (HCC) labwork pending - cmp and hgb a1c -  Pt states he will not restart medication until labwork resulted 2. Mixed hyperlipidemia  watch diet Pt refuses med Pt refuses to have lipid level drawn today  No orders of the defined types were placed in this encounter.   Orders Placed This Encounter  Procedures   CBC with Differential/Platelet   Comprehensive metabolic panel   Hemoglobin A1c   POCT UA - Microalbumin     Follow-up: Return in about 4 months (around 12/05/2021) for chronic fasting follow up.  An After Visit Summary was printed and given to the patient.  Jettie Pagan [facility] [phone]

## 2021-08-06 LAB — CBC WITH DIFFERENTIAL/PLATELET
Basophils Absolute: 0.1 10*3/uL (ref 0.0–0.2)
Basos: 1 %
EOS (ABSOLUTE): 0.2 10*3/uL (ref 0.0–0.4)
Eos: 2 %
Hematocrit: 42 % (ref 37.5–51.0)
Hemoglobin: 13.8 g/dL (ref 13.0–17.7)
Immature Grans (Abs): 0 10*3/uL (ref 0.0–0.1)
Immature Granulocytes: 0 %
Lymphocytes Absolute: 3.4 10*3/uL — ABNORMAL HIGH (ref 0.7–3.1)
Lymphs: 43 %
MCH: 28.8 pg (ref 26.6–33.0)
MCHC: 32.9 g/dL (ref 31.5–35.7)
MCV: 88 fL (ref 79–97)
Monocytes Absolute: 0.5 10*3/uL (ref 0.1–0.9)
Monocytes: 7 %
Neutrophils Absolute: 3.7 10*3/uL (ref 1.4–7.0)
Neutrophils: 47 %
Platelets: 283 10*3/uL (ref 150–450)
RBC: 4.79 x10E6/uL (ref 4.14–5.80)
RDW: 12.1 % (ref 11.6–15.4)
WBC: 7.8 10*3/uL (ref 3.4–10.8)

## 2021-08-06 LAB — COMPREHENSIVE METABOLIC PANEL
ALT: 86 IU/L — ABNORMAL HIGH (ref 0–44)
AST: 44 IU/L — ABNORMAL HIGH (ref 0–40)
Albumin/Globulin Ratio: 2 (ref 1.2–2.2)
Albumin: 4.8 g/dL (ref 3.8–4.9)
Alkaline Phosphatase: 67 IU/L (ref 44–121)
BUN/Creatinine Ratio: 10 (ref 9–20)
BUN: 10 mg/dL (ref 6–24)
Bilirubin Total: 0.4 mg/dL (ref 0.0–1.2)
CO2: 25 mmol/L (ref 20–29)
Calcium: 9.2 mg/dL (ref 8.7–10.2)
Chloride: 99 mmol/L (ref 96–106)
Creatinine, Ser: 1.01 mg/dL (ref 0.76–1.27)
Globulin, Total: 2.4 g/dL (ref 1.5–4.5)
Glucose: 171 mg/dL — ABNORMAL HIGH (ref 70–99)
Potassium: 4.6 mmol/L (ref 3.5–5.2)
Sodium: 138 mmol/L (ref 134–144)
Total Protein: 7.2 g/dL (ref 6.0–8.5)
eGFR: 90 mL/min/{1.73_m2} (ref >59)

## 2021-08-06 LAB — HEMOGLOBIN A1C
Est. average glucose Bld gHb Est-mCnc: 200 mg/dL
Hgb A1c MFr Bld: 8.6 % — ABNORMAL HIGH (ref 4.8–5.6)

## 2021-08-09 ENCOUNTER — Ambulatory Visit: Payer: Self-pay | Admitting: Physician Assistant

## 2021-08-12 ENCOUNTER — Ambulatory Visit: Payer: Self-pay | Admitting: Physician Assistant
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
## 2021-09-29 ENCOUNTER — Other Ambulatory Visit: Payer: Self-pay | Admitting: Physician Assistant

## 2021-12-06 ENCOUNTER — Encounter: Payer: Self-pay | Admitting: Physician Assistant

## 2021-12-06 ENCOUNTER — Ambulatory Visit (INDEPENDENT_AMBULATORY_CARE_PROVIDER_SITE_OTHER): Payer: Self-pay | Admitting: Physician Assistant

## 2021-12-06 ENCOUNTER — Other Ambulatory Visit: Payer: Self-pay

## 2021-12-06 VITALS — BP 122/62 | HR 62 | Temp 98.2°F | Ht 72.0 in | Wt 238.2 lb

## 2021-12-06 DIAGNOSIS — E1165 Type 2 diabetes mellitus with hyperglycemia: Secondary | ICD-10-CM

## 2021-12-06 NOTE — Progress Notes (Signed)
Subjective:  Patient ID: John Valencia, male    DOB: Dec 09, 1970  Age: 51 y.o. MRN: [id number]  Chief Complaint  Patient presents with   Hypertension   Diabetes   Hyperlipidemia    Diabetes  Hypertension  Hyperlipidemia   pt with history of diabetes - pt states that he has been taking his glucophage twice daily - does not check glucose at home - voices no concerns or problems It is noted that he has lost 18 pounds in the past 4 months but he states that he has drastically changed his diet and drinking lots of water - stopped drinking sodas and eating healthier choices Has refused treatment with ARBs/ACEs or statins  Pt with known elevated lipids - refuses medication and does not want lipid panel checked today Current Outpatient Medications on File Prior to Visit  Medication Sig Dispense Refill   Aspirin-Acetaminophen-Caffeine (EXCEDRIN MIGRAINE PO) Take by mouth.     lansoprazole (PREVACID) 30 MG capsule TAKE 1 CAPSULE BY MOUTH DAILY 90 capsule 3   metFORMIN (GLUCOPHAGE) 1000 MG tablet Take 1 tablet (1,000 mg total) by mouth 2 (two) times daily with a meal. 180 tablet 0   No current facility-administered medications on file prior to visit.   Past Medical History:  Diagnosis Date   Asthma    GERD (gastroesophageal reflux disease)    Past Surgical History:  Procedure Laterality Date   gerd      Family History  Problem Relation Age of Onset   Cerebrovascular Accident Maternal Grandmother    Diabetes type II Other    Cancer Other    Social History   Socioeconomic History   Marital status: Widowed    Spouse name: Not on file   Number of children: 3   Years of education: Not on file   Highest education level: Not on file  Occupational History   Occupation: Self employed- Dealer  Tobacco Use   Smoking status: Former    Packs/day: 1.00    Types: Cigarettes   Smokeless tobacco: Never  Vaping Use   Vaping Use: Never used  Substance and Sexual Activity   Alcohol  use: No   Drug use: No   Sexual activity: Never  Other Topics Concern   Not on file  Social History Narrative   Not on file   Social Determinants of Health   Financial Resource Strain: Not on file  Food Insecurity: Not on file  Transportation Needs: Not on file  Physical Activity: Not on file  Stress: Not on file  Social Connections: Not on file   CONSTITUTIONAL: see HPI E/N/T: Negative for ear pain, nasal congestion and sore throat.  CARDIOVASCULAR: Negative for chest pain, dizziness, palpitations and pedal edema.  RESPIRATORY: Negative for recent cough and dyspnea.  GASTROINTESTINAL: Negative for abdominal pain, acid reflux symptoms, constipation, diarrhea, nausea and vomiting. - no melena or hematochezia MSK: Negative for arthralgias and myalgias.  INTEGUMENTARY: Negative for rash.  NEUROLOGICAL: Negative for dizziness and headaches.  PSYCHIATRIC: Negative for sleep disturbance and to question depression screen.  Negative for depression, negative for anhedonia.      Objective:  PHYSICAL EXAM:   VS: BP 122/62    Pulse 62    Temp 98.2 F (36.8 C)    Ht 6' (1.829 m)    Wt 238 lb 3.2 oz (108 kg)    BMI 32.31 kg/m   GEN: Well nourished, well developed, in no acute distress  Cardiac: RRR; no murmurs, rubs, or gallops,no  edema -  Respiratory:  normal respiratory rate and pattern with no distress - normal breath sounds with no rales, rhonchi, wheezes or rubs GI: normal bowel sounds, no masses or tenderness MS: no deformity or atrophy  Skin: warm and dry, no rash  Psych: euthymic mood, appropriate affect and demeanor  No visits with results within 1 Day(s) from this visit.  Latest known visit with results is:  Office Visit on 08/05/2021  Component Date Value Ref Range Status   Microalbumin Ur, POC 08/05/2021 10  mg/L Final   aware   WBC 08/05/2021 7.8  3.4 - 10.8 x10E3/uL Final   RBC 08/05/2021 4.79  4.14 - 5.80 x10E6/uL Final   Hemoglobin 08/05/2021 13.8  13.0 - 17.7  g/dL Final   Hematocrit 08/05/2021 42.0  37.5 - 51.0 % Final   MCV 08/05/2021 88  79 - 97 fL Final   MCH 08/05/2021 28.8  26.6 - 33.0 pg Final   MCHC 08/05/2021 32.9  31.5 - 35.7 g/dL Final   RDW 08/05/2021 12.1  11.6 - 15.4 % Final   Platelets 08/05/2021 283  150 - 450 x10E3/uL Final   Neutrophils 08/05/2021 47  Not Estab. % Final   Lymphs 08/05/2021 43  Not Estab. % Final   Monocytes 08/05/2021 7  Not Estab. % Final   Eos 08/05/2021 2  Not Estab. % Final   Basos 08/05/2021 1  Not Estab. % Final   Neutrophils Absolute 08/05/2021 3.7  1.4 - 7.0 x10E3/uL Final   Lymphocytes Absolute 08/05/2021 3.4 (H)  0.7 - 3.1 x10E3/uL Final   Monocytes Absolute 08/05/2021 0.5  0.1 - 0.9 x10E3/uL Final   EOS (ABSOLUTE) 08/05/2021 0.2  0.0 - 0.4 x10E3/uL Final   Basophils Absolute 08/05/2021 0.1  0.0 - 0.2 x10E3/uL Final   Immature Granulocytes 08/05/2021 0  Not Estab. % Final   Immature Grans (Abs) 08/05/2021 0.0  0.0 - 0.1 x10E3/uL Final   Glucose 08/05/2021 171 (H)  70 - 99 mg/dL Final                 **Please note reference interval change**   BUN 08/05/2021 10  6 - 24 mg/dL Final   Creatinine, Ser 08/05/2021 1.01  0.76 - 1.27 mg/dL Final   eGFR 08/05/2021 90  >59 mL/min/1.73 Final   BUN/Creatinine Ratio 08/05/2021 10  9 - 20 Final   Sodium 08/05/2021 138  134 - 144 mmol/L Final   Potassium 08/05/2021 4.6  3.5 - 5.2 mmol/L Final   Chloride 08/05/2021 99  96 - 106 mmol/L Final   CO2 08/05/2021 25  20 - 29 mmol/L Final   Calcium 08/05/2021 9.2  8.7 - 10.2 mg/dL Final   Total Protein 08/05/2021 7.2  6.0 - 8.5 g/dL Final   Albumin 08/05/2021 4.8  3.8 - 4.9 g/dL Final   Globulin, Total 08/05/2021 2.4  1.5 - 4.5 g/dL Final   Albumin/Globulin Ratio 08/05/2021 2.0  1.2 - 2.2 Final   Bilirubin Total 08/05/2021 0.4  0.0 - 1.2 mg/dL Final   Alkaline Phosphatase 08/05/2021 67  44 - 121 IU/L Final   AST 08/05/2021 44 (H)  0 - 40 IU/L Final   ALT 08/05/2021 86 (H)  0 - 44 IU/L Final   Hgb A1c MFr Bld  08/05/2021 8.6 (H)  4.8 - 5.6 % Final   Comment:          Prediabetes: 5.7 - 6.4          Diabetes: >6.4  Glycemic control for adults with diabetes: <7.0    Est. average glucose Bld gHb Est-m* 08/05/2021 200  mg/dL Final    Diabetic Foot Exam - Simple   No data filed      Lab Results  Component Value Date   WBC 7.8 08/05/2021   HGB 13.8 08/05/2021   HCT 42.0 08/05/2021   PLT 283 08/05/2021   GLUCOSE 171 (H) 08/05/2021   CHOL 196 01/05/2021   TRIG 216 (H) 01/05/2021   HDL 35 (L) 01/05/2021   LDLCALC 123 (H) 01/05/2021   ALT 86 (H) 08/05/2021   AST 44 (H) 08/05/2021   NA 138 08/05/2021   K 4.6 08/05/2021   CL 99 08/05/2021   CREATININE 1.01 08/05/2021   BUN 10 08/05/2021   CO2 25 08/05/2021   TSH 1.960 09/21/2020   HGBA1C 8.6 (H) 08/05/2021   MICROALBUR 10 08/05/2021      Assessment & Plan:   1. Type 2 diabetes mellitus with hyperglycemia, without long-term current use of insulin (HCC) labwork pending - cbc, cmp, hgb a1c 2. Mixed hyperlipidemia  watch diet Pt refuses med Pt refuses to have lipid level drawn today  No orders of the defined types were placed in this encounter.    Orders Placed This Encounter  Procedures   CBC with Differential/Platelet   Comprehensive metabolic panel   Hemoglobin A1c     Follow-up: Return in about 4 months (around 04/05/2022) for chronic fasting follow up.  An After Visit Summary was printed and given to the patient.  Yetta Flock [facility] [phone]

## 2021-12-07 LAB — CBC WITH DIFFERENTIAL/PLATELET
Basophils Absolute: 0.1 10*3/uL (ref 0.0–0.2)
Basos: 1 %
EOS (ABSOLUTE): 0.2 10*3/uL (ref 0.0–0.4)
Eos: 2 %
Hematocrit: 43.3 % (ref 37.5–51.0)
Hemoglobin: 14.6 g/dL (ref 13.0–17.7)
Immature Grans (Abs): 0 10*3/uL (ref 0.0–0.1)
Immature Granulocytes: 0 %
Lymphocytes Absolute: 3.1 10*3/uL (ref 0.7–3.1)
Lymphs: 43 %
MCH: 28.7 pg (ref 26.6–33.0)
MCHC: 33.7 g/dL (ref 31.5–35.7)
MCV: 85 fL (ref 79–97)
Monocytes Absolute: 0.4 10*3/uL (ref 0.1–0.9)
Monocytes: 6 %
Neutrophils Absolute: 3.5 10*3/uL (ref 1.4–7.0)
Neutrophils: 48 %
Platelets: 288 10*3/uL (ref 150–450)
RBC: 5.08 x10E6/uL (ref 4.14–5.80)
RDW: 11.8 % (ref 11.6–15.4)
WBC: 7.3 10*3/uL (ref 3.4–10.8)

## 2021-12-07 LAB — COMPREHENSIVE METABOLIC PANEL
ALT: 83 IU/L — ABNORMAL HIGH (ref 0–44)
AST: 29 IU/L (ref 0–40)
Albumin/Globulin Ratio: 2 (ref 1.2–2.2)
Albumin: 4.6 g/dL (ref 3.8–4.9)
Alkaline Phosphatase: 90 IU/L (ref 44–121)
BUN/Creatinine Ratio: 12 (ref 9–20)
BUN: 12 mg/dL (ref 6–24)
Bilirubin Total: 0.3 mg/dL (ref 0.0–1.2)
CO2: 25 mmol/L (ref 20–29)
Calcium: 9.4 mg/dL (ref 8.7–10.2)
Chloride: 96 mmol/L (ref 96–106)
Creatinine, Ser: 1 mg/dL (ref 0.76–1.27)
Globulin, Total: 2.3 g/dL (ref 1.5–4.5)
Glucose: 320 mg/dL — ABNORMAL HIGH (ref 70–99)
Potassium: 4.6 mmol/L (ref 3.5–5.2)
Sodium: 135 mmol/L (ref 134–144)
Total Protein: 6.9 g/dL (ref 6.0–8.5)
eGFR: 91 mL/min/{1.73_m2} (ref >59)

## 2021-12-07 LAB — HEMOGLOBIN A1C
Est. average glucose Bld gHb Est-mCnc: 332 mg/dL
Hgb A1c MFr Bld: 13.2 % — ABNORMAL HIGH (ref 4.8–5.6)

## 2021-12-14 ENCOUNTER — Other Ambulatory Visit: Payer: Self-pay | Admitting: Physician Assistant

## 2021-12-14 MED ORDER — PRAVASTATIN SODIUM 10 MG PO TABS: 10.0000 mg | ORAL_TABLET | Freq: Every day | ORAL | 0 refills | Status: DC

## 2021-12-14 MED ORDER — RAMIPRIL 2.5 MG PO CAPS: 2.5000 mg | ORAL_CAPSULE | Freq: Every day | ORAL | 0 refills | Status: DC

## 2021-12-14 MED ORDER — METFORMIN HCL 1000 MG PO TABS: 1000.0000 mg | ORAL_TABLET | Freq: Two times a day (BID) | ORAL | 0 refills | Status: DC

## 2021-12-15 ENCOUNTER — Other Ambulatory Visit: Payer: Self-pay | Admitting: Physician Assistant

## 2021-12-15 DIAGNOSIS — E1165 Type 2 diabetes mellitus with hyperglycemia: Secondary | ICD-10-CM

## 2021-12-15 MED ORDER — PIOGLITAZONE HCL 15 MG PO TABS: 15.0000 mg | ORAL_TABLET | Freq: Every day | ORAL | 2 refills | Status: DC

## 2022-02-12 ENCOUNTER — Other Ambulatory Visit: Payer: Self-pay | Admitting: Physician Assistant

## 2022-03-18 ENCOUNTER — Other Ambulatory Visit: Payer: Self-pay | Admitting: Physician Assistant

## 2022-03-18 MED ORDER — RAMIPRIL 2.5 MG PO CAPS: 2.5000 mg | ORAL_CAPSULE | Freq: Every day | ORAL | 0 refills | Status: DC

## 2022-04-01 ENCOUNTER — Other Ambulatory Visit: Payer: Self-pay

## 2022-04-01 DIAGNOSIS — E1165 Type 2 diabetes mellitus with hyperglycemia: Secondary | ICD-10-CM

## 2022-04-01 MED ORDER — PIOGLITAZONE HCL 15 MG PO TABS: 15.0000 mg | ORAL_TABLET | Freq: Every day | ORAL | 2 refills | Status: DC

## 2022-04-08 ENCOUNTER — Ambulatory Visit (INDEPENDENT_AMBULATORY_CARE_PROVIDER_SITE_OTHER): Payer: Self-pay | Admitting: Physician Assistant

## 2022-04-08 ENCOUNTER — Encounter: Payer: Self-pay | Admitting: Physician Assistant

## 2022-04-08 VITALS — BP 124/74 | HR 69 | Resp 18 | Ht 72.0 in | Wt 247.0 lb

## 2022-04-08 DIAGNOSIS — E782 Mixed hyperlipidemia: Secondary | ICD-10-CM

## 2022-04-08 DIAGNOSIS — E1165 Type 2 diabetes mellitus with hyperglycemia: Secondary | ICD-10-CM

## 2022-04-08 NOTE — Progress Notes (Signed)
Subjective:  Patient ID: John Valencia, male    DOB: 11-Jan-1971  Age: 51 y.o. MRN: [id number]  Chief Complaint  Patient presents with   Diabetes    Diabetes  Hypertension  Hyperlipidemia   pt with history of diabetes - pt states that he has been taking his glucophage twice daily and says he did start the actos 65m qd but ran out of that medication 2 weeks ago- does not check glucose at home - voices no concerns or problems Pt also states he started pravastatin and ramipril as directed  Current Outpatient Medications on File Prior to Visit  Medication Sig Dispense Refill   Aspirin-Acetaminophen-Caffeine (EXCEDRIN MIGRAINE PO) Take by mouth.     lansoprazole (PREVACID) 30 MG capsule TAKE 1 CAPSULE BY MOUTH DAILY 90 capsule 3   metFORMIN (GLUCOPHAGE) 1000 MG tablet Take 1 tablet (1,000 mg total) by mouth 2 (two) times daily with a meal. 180 tablet 0   pioglitazone (ACTOS) 15 MG tablet Take 1 tablet (15 mg total) by mouth daily. 30 tablet 2   pravastatin (PRAVACHOL) 10 MG tablet TAKE 1 TABLET BY MOUTH DAILY 90 tablet 0   ramipril (ALTACE) 2.5 MG capsule Take 1 capsule (2.5 mg total) by mouth daily. 90 capsule 0   No current facility-administered medications on file prior to visit.   Past Medical History:  Diagnosis Date   Asthma    GERD (gastroesophageal reflux disease)    Past Surgical History:  Procedure Laterality Date   gerd      Family History  Problem Relation Age of Onset   Cerebrovascular Accident Maternal Grandmother    Diabetes type II Other    Cancer Other    Social History   Socioeconomic History   Marital status: Widowed    Spouse name: Not on file   Number of children: 3   Years of education: Not on file   Highest education level: Not on file  Occupational History   Occupation: Self employed- [employer] Tobacco Use   Smoking status: Former    Packs/day: 1.00    Types: Cigarettes   Smokeless tobacco: Never  Vaping Use   Vaping Use: Never used   Substance and Sexual Activity   Alcohol use: No   Drug use: No   Sexual activity: Never  Other Topics Concern   Not on file  Social History Narrative   Not on file   Social Determinants of Health   Financial Resource Strain: Not on file  Food Insecurity: Not on file  Transportation Needs: Not on file  Physical Activity: Not on file  Stress: Not on file  Social Connections: Not on file   CONSTITUTIONAL: Negative for chills, fatigue, fever, unintentional weight gain and unintentional weight loss.  E/N/T: Negative for ear pain, nasal congestion and sore throat.  CARDIOVASCULAR: Negative for chest pain, dizziness, palpitations and pedal edema.  RESPIRATORY: Negative for recent cough and dyspnea.  GASTROINTESTINAL: Negative for abdominal pain, acid reflux symptoms, constipation, diarrhea, nausea and vomiting.  MSK: Negative for arthralgias and myalgias.  INTEGUMENTARY: Negative for rash.  NEUROLOGICAL: Negative for dizziness and headaches.  PSYCHIATRIC: Negative for sleep disturbance and to question depression screen.  Negative for depression, negative for anhedonia.       Objective:  PHYSICAL EXAM:   VS: BP 124/74   Pulse 69   Resp 18   Ht 6' (1.829 m)   Wt 247 lb (112 kg)   SpO2 96%   BMI 33.50 kg/m  GEN: Well nourished, well developed, in no acute distress  Cardiac: RRR; no murmurs, rubs, or gallops,no edema -  Respiratory:  normal respiratory rate and pattern with no distress - normal breath sounds with no rales, rhonchi, wheezes or rubs MS: no deformity or atrophy  Skin: warm and dry, no rash  Psych: euthymic mood, appropriate affect and demeanor  No visits with results within 1 Day(s) from this visit.  Latest known visit with results is:  Office Visit on 12/06/2021  Component Date Value Ref Range Status   WBC 12/06/2021 7.3  3.4 - 10.8 x10E3/uL Final   RBC 12/06/2021 5.08  4.14 - 5.80 x10E6/uL Final   Hemoglobin 12/06/2021 14.6  13.0 - 17.7 g/dL Final    Hematocrit 12/06/2021 43.3  37.5 - 51.0 % Final   MCV 12/06/2021 85  79 - 97 fL Final   MCH 12/06/2021 28.7  26.6 - 33.0 pg Final   MCHC 12/06/2021 33.7  31.5 - 35.7 g/dL Final   RDW 12/06/2021 11.8  11.6 - 15.4 % Final   Platelets 12/06/2021 288  150 - 450 x10E3/uL Final   Neutrophils 12/06/2021 48  Not Estab. % Final   Lymphs 12/06/2021 43  Not Estab. % Final   Monocytes 12/06/2021 6  Not Estab. % Final   Eos 12/06/2021 2  Not Estab. % Final   Basos 12/06/2021 1  Not Estab. % Final   Neutrophils Absolute 12/06/2021 3.5  1.4 - 7.0 x10E3/uL Final   Lymphocytes Absolute 12/06/2021 3.1  0.7 - 3.1 x10E3/uL Final   Monocytes Absolute 12/06/2021 0.4  0.1 - 0.9 x10E3/uL Final   EOS (ABSOLUTE) 12/06/2021 0.2  0.0 - 0.4 x10E3/uL Final   Basophils Absolute 12/06/2021 0.1  0.0 - 0.2 x10E3/uL Final   Immature Granulocytes 12/06/2021 0  Not Estab. % Final   Immature Grans (Abs) 12/06/2021 0.0  0.0 - 0.1 x10E3/uL Final   Glucose 12/06/2021 320 (H)  70 - 99 mg/dL Final   BUN 12/06/2021 12  6 - 24 mg/dL Final   Creatinine, Ser 12/06/2021 1.00  0.76 - 1.27 mg/dL Final   eGFR 12/06/2021 91  >59 mL/min/1.73 Final   BUN/Creatinine Ratio 12/06/2021 12  9 - 20 Final   Sodium 12/06/2021 135  134 - 144 mmol/L Final   Potassium 12/06/2021 4.6  3.5 - 5.2 mmol/L Final   Chloride 12/06/2021 96  96 - 106 mmol/L Final   CO2 12/06/2021 25  20 - 29 mmol/L Final   Calcium 12/06/2021 9.4  8.7 - 10.2 mg/dL Final   Total Protein 12/06/2021 6.9  6.0 - 8.5 g/dL Final   Albumin 12/06/2021 4.6  3.8 - 4.9 g/dL Final   Globulin, Total 12/06/2021 2.3  1.5 - 4.5 g/dL Final   Albumin/Globulin Ratio 12/06/2021 2.0  1.2 - 2.2 Final   Bilirubin Total 12/06/2021 0.3  0.0 - 1.2 mg/dL Final   Alkaline Phosphatase 12/06/2021 90  44 - 121 IU/L Final   AST 12/06/2021 29  0 - 40 IU/L Final   ALT 12/06/2021 83 (H)  0 - 44 IU/L Final   Hgb A1c MFr Bld 12/06/2021 13.2 (H)  4.8 - 5.6 % Final   Comment: **Verified by repeat analysis**           Prediabetes: 5.7 - 6.4          Diabetes: >6.4          Glycemic control for adults with diabetes: <7.0    Est. average glucose Bld gHb Est-m*  12/06/2021 332  mg/dL Final    Diabetic Foot Exam - Simple   No data filed      Lab Results  Component Value Date   WBC 7.3 12/06/2021   HGB 14.6 12/06/2021   HCT 43.3 12/06/2021   PLT 288 12/06/2021   GLUCOSE 320 (H) 12/06/2021   CHOL 196 01/05/2021   TRIG 216 (H) 01/05/2021   HDL 35 (L) 01/05/2021   LDLCALC 123 (H) 01/05/2021   ALT 83 (H) 12/06/2021   AST 29 12/06/2021   NA 135 12/06/2021   K 4.6 12/06/2021   CL 96 12/06/2021   CREATININE 1.00 12/06/2021   BUN 12 12/06/2021   CO2 25 12/06/2021   TSH 1.960 09/21/2020   HGBA1C 13.2 (H) 12/06/2021   MICROALBUR 10 08/05/2021      Assessment & Plan:   1. Type 2 diabetes mellitus with hyperglycemia, without long-term current use of insulin (HCC) labwork pending - cmp, hgb a1c Watch diet Continue meds 2. Mixed hyperlipidemia  watch diet Lipid panel pending  Continue meds  No orders of the defined types were placed in this encounter.    Orders Placed This Encounter  Procedures   Comprehensive metabolic panel   Hemoglobin A1c   Lipid panel     Follow-up: Return in about 4 months (around 08/08/2022) for fasting follow up.  An After Visit Summary was printed and given to the patient.  Yetta Flock [facility] [phone]

## 2022-04-09 LAB — COMPREHENSIVE METABOLIC PANEL
ALT: 61 IU/L — ABNORMAL HIGH (ref 0–44)
AST: 35 IU/L (ref 0–40)
Albumin/Globulin Ratio: 2.4 — ABNORMAL HIGH (ref 1.2–2.2)
Albumin: 4.7 g/dL (ref 3.8–4.9)
Alkaline Phosphatase: 57 IU/L (ref 44–121)
BUN/Creatinine Ratio: 15 (ref 9–20)
BUN: 15 mg/dL (ref 6–24)
Bilirubin Total: 0.4 mg/dL (ref 0.0–1.2)
CO2: 25 mmol/L (ref 20–29)
Calcium: 9.5 mg/dL (ref 8.7–10.2)
Chloride: 100 mmol/L (ref 96–106)
Creatinine, Ser: 1.03 mg/dL (ref 0.76–1.27)
Globulin, Total: 2 g/dL (ref 1.5–4.5)
Glucose: 219 mg/dL — ABNORMAL HIGH (ref 70–99)
Potassium: 4.8 mmol/L (ref 3.5–5.2)
Sodium: 138 mmol/L (ref 134–144)
Total Protein: 6.7 g/dL (ref 6.0–8.5)
eGFR: 88 mL/min/{1.73_m2} (ref >59)

## 2022-04-09 LAB — LIPID PANEL
Chol/HDL Ratio: 4.8 ratio (ref 0.0–5.0)
Cholesterol, Total: 157 mg/dL (ref 100–199)
HDL: 33 mg/dL — ABNORMAL LOW (ref >39)
LDL Chol Calc (NIH): 89 mg/dL (ref 0–99)
Triglycerides: 206 mg/dL — ABNORMAL HIGH (ref 0–149)
VLDL Cholesterol Cal: 35 mg/dL (ref 5–40)

## 2022-04-09 LAB — HEMOGLOBIN A1C
Est. average glucose Bld gHb Est-mCnc: 237 mg/dL
Hgb A1c MFr Bld: 9.9 % — ABNORMAL HIGH (ref 4.8–5.6)

## 2022-04-09 LAB — CARDIOVASCULAR RISK ASSESSMENT

## 2022-04-10 ENCOUNTER — Other Ambulatory Visit: Payer: Self-pay | Admitting: Physician Assistant

## 2022-04-10 MED ORDER — PIOGLITAZONE HCL 30 MG PO TABS: 30.0000 mg | ORAL_TABLET | Freq: Every day | ORAL | 3 refills | Status: DC

## 2022-04-18 ENCOUNTER — Other Ambulatory Visit: Payer: Self-pay | Admitting: Physician Assistant

## 2022-04-18 ENCOUNTER — Other Ambulatory Visit: Payer: Self-pay

## 2022-04-18 MED ORDER — RAMIPRIL 2.5 MG PO CAPS
2.5000 mg | ORAL_CAPSULE | Freq: Every day | ORAL | 1 refills | Status: DC
Start: 2022-04-18 — End: 2022-09-22

## 2022-04-18 MED ORDER — METFORMIN HCL 1000 MG PO TABS: 1000.0000 mg | ORAL_TABLET | Freq: Two times a day (BID) | ORAL | 0 refills | Status: DC

## 2022-04-18 MED ORDER — METFORMIN HCL 1000 MG PO TABS: 1000.0000 mg | ORAL_TABLET | Freq: Two times a day (BID) | ORAL | 1 refills | Status: DC

## 2022-04-18 MED ORDER — PRAVASTATIN SODIUM 10 MG PO TABS: 10.0000 mg | ORAL_TABLET | Freq: Every day | ORAL | 1 refills | Status: DC

## 2022-05-20 ENCOUNTER — Other Ambulatory Visit: Payer: Self-pay | Admitting: Physician Assistant

## 2022-05-20 MED ORDER — PIOGLITAZONE HCL 30 MG PO TABS
30.0000 mg | ORAL_TABLET | Freq: Every day | ORAL | 3 refills | Status: DC
Start: 2022-05-20 — End: 2022-08-10

## 2022-07-25 ENCOUNTER — Other Ambulatory Visit: Payer: Self-pay | Admitting: Family Medicine

## 2022-08-09 ENCOUNTER — Ambulatory Visit: Payer: Self-pay | Admitting: Physician Assistant

## 2022-08-09 ENCOUNTER — Encounter: Payer: Self-pay | Admitting: Physician Assistant

## 2022-08-09 VITALS — BP 118/76 | HR 80 | Temp 97.1°F | Ht 72.0 in | Wt 251.4 lb

## 2022-08-09 DIAGNOSIS — Z125 Encounter for screening for malignant neoplasm of prostate: Secondary | ICD-10-CM

## 2022-08-09 DIAGNOSIS — R0789 Other chest pain: Secondary | ICD-10-CM

## 2022-08-09 DIAGNOSIS — E782 Mixed hyperlipidemia: Secondary | ICD-10-CM

## 2022-08-09 DIAGNOSIS — E1165 Type 2 diabetes mellitus with hyperglycemia: Secondary | ICD-10-CM

## 2022-08-09 DIAGNOSIS — K2100 Gastro-esophageal reflux disease with esophagitis, without bleeding: Secondary | ICD-10-CM

## 2022-08-09 NOTE — Progress Notes (Signed)
Subjective:  Patient ID: John Valencia, male    DOB: 1971-04-26  Age: 51 y.o. MRN: [id number]  Chief Complaint  Patient presents with   Diabetes    HPI  Pt in today for follow up of diabetes.  He does not check sugars at all and has deferred seeing nutritionist.  Last a1c extremely elevated - he states he is taking his medications as directed now which is metformin 1000mg  bid and actos 30mg  qd - because of cost he is not able to start GLP1 and will not take insulin Pt is due for eye exam Pt not able to get ua for microalbumin test Pt refuses to have foot exam done He is taking altace 2.5mg  and pravachl 10mg  qd  Pt with history of GERD -symptoms stable on prevacid 30mg  qd Pt mentions that he has had intermittent chest pains but adamantly refuses to get EKG or do cardiology referral despite fact he is at high risk for CAD/event given his history of uncontrolled diabetes Current Outpatient Medications on File Prior to Visit  Medication Sig Dispense Refill   Aspirin-Acetaminophen-Caffeine (EXCEDRIN MIGRAINE PO) Take by mouth.     lansoprazole (PREVACID) 30 MG capsule TAKE 1 CAPSULE BY MOUTH DAILY 90 capsule 3   metFORMIN (GLUCOPHAGE) 1000 MG tablet Take 1 tablet by mouth 2 times daily with a meal. 180 tablet 0   pioglitazone (ACTOS) 30 MG tablet Take 1 tablet (30 mg total) by mouth daily. 30 tablet 3   pravastatin (PRAVACHOL) 10 MG tablet Take 1 tablet (10 mg total) by mouth daily. 90 tablet 1   ramipril (ALTACE) 2.5 MG capsule Take 1 capsule (2.5 mg total) by mouth daily. 90 capsule 1   No current facility-administered medications on file prior to visit.   Past Medical History:  Diagnosis Date   Asthma    GERD (gastroesophageal reflux disease)    Past Surgical History:  Procedure Laterality Date   gerd      Family History  Problem Relation Age of Onset   Cerebrovascular Accident Maternal Grandmother    Diabetes type II Other    Cancer Other    Social History    Socioeconomic History   Marital status: Widowed    Spouse name: Not on file   Number of children: 3   Years of education: Not on file   Highest education level: Not on file  Occupational History   Occupation: Self employed- Dealer  Tobacco Use   Smoking status: Former    Packs/day: 1.00    Types: Cigarettes   Smokeless tobacco: Never  Vaping Use   Vaping Use: Never used  Substance and Sexual Activity   Alcohol use: No   Drug use: No   Sexual activity: Never  Other Topics Concern   Not on file  Social History Narrative   Not on file   Social Determinants of Health   Financial Resource Strain: Not on file  Food Insecurity: Not on file  Transportation Needs: Not on file  Physical Activity: Not on file  Stress: Not on file  Social Connections: Not on file    Review of Systems  CONSTITUTIONAL: Negative for chills, fatigue, fever, unintentional weight gain and unintentional weight loss.  E/N/T: Negative for ear pain, nasal congestion and sore throat.  CARDIOVASCULAR: see HPI RESPIRATORY: Negative for recent cough and dyspnea.  GASTROINTESTINAL: Negative for abdominal pain, acid reflux symptoms, constipation, diarrhea, nausea and vomiting.  MSK: Negative for arthralgias and myalgias.    Objective:  PHYSICAL EXAM:   VS: BP 118/76 (BP Location: Left Arm, Patient Position: Sitting, Cuff Size: Large)   Pulse 80   Temp (!) 97.1 F (36.2 C) (Temporal)   Ht 6' (1.829 m)   Wt 251 lb 6.4 oz (114 kg)   SpO2 98%   BMI 34.10 kg/m   GEN: Well nourished, well developed, in no acute distress   Cardiac: RRR; no murmurs, rubs, or gallops,no edema -  Respiratory:  normal respiratory rate and pattern with no distress - normal breath sounds with no rales, rhonchi, wheezes or rubs Skin: warm and dry, no rash  Psych: euthymic mood, appropriate affect and demeanor  Lab Results  Component Value Date   WBC 7.3 12/06/2021   HGB 14.6 12/06/2021   HCT 43.3 12/06/2021   PLT 288  12/06/2021   GLUCOSE 219 (H) 04/08/2022   CHOL 157 04/08/2022   TRIG 206 (H) 04/08/2022   HDL 33 (L) 04/08/2022   LDLCALC 89 04/08/2022   ALT 61 (H) 04/08/2022   AST 35 04/08/2022   NA 138 04/08/2022   K 4.8 04/08/2022   CL 100 04/08/2022   CREATININE 1.03 04/08/2022   BUN 15 04/08/2022   CO2 25 04/08/2022   TSH 1.960 09/21/2020   HGBA1C 9.9 (H) 04/08/2022   MICROALBUR 10 08/05/2021      Assessment & Plan:   Problem List Items Addressed This Visit       Digestive   Gastroesophageal reflux disease Continue med     Endocrine   Type 2 diabetes mellitus with hyperglycemia, without long-term current use of insulin (HCC) - Primary   Relevant Orders   CBC with Differential/Platelet   Comprehensive metabolic panel   Lipid panel   Hemoglobin A1c Watch diet     Other   Other chest pain Recommend referral to cardiology - pt refuses and refuses EKG in office If symptoms recur/worsen to go to ED for further evaluation   Mixed hyperlipidemia   Relevant Orders   Lipid panel Continue meds   Other Visit Diagnoses     Prostate cancer screening       Relevant Orders   PSA     .  No orders of the defined types were placed in this encounter.   Orders Placed This Encounter  Procedures   CBC with Differential/Platelet   Comprehensive metabolic panel   Lipid panel   Hemoglobin A1c   PSA     Follow-up: Return in about 4 months (around 12/10/2022) for chronic fasting follow up.  An After Visit Summary was printed and given to the patient.  Jettie Pagan [facility] [phone]

## 2022-08-10 ENCOUNTER — Other Ambulatory Visit: Payer: Self-pay | Admitting: Physician Assistant

## 2022-08-10 DIAGNOSIS — E1165 Type 2 diabetes mellitus with hyperglycemia: Secondary | ICD-10-CM

## 2022-08-10 LAB — LIPID PANEL
Chol/HDL Ratio: 4.8 ratio (ref 0.0–5.0)
Cholesterol, Total: 188 mg/dL (ref 100–199)
HDL: 39 mg/dL — ABNORMAL LOW (ref >39)
LDL Chol Calc (NIH): 112 mg/dL — ABNORMAL HIGH (ref 0–99)
Triglycerides: 214 mg/dL — ABNORMAL HIGH (ref 0–149)
VLDL Cholesterol Cal: 37 mg/dL (ref 5–40)

## 2022-08-10 LAB — COMPREHENSIVE METABOLIC PANEL
ALT: 59 IU/L — ABNORMAL HIGH (ref 0–44)
AST: 24 IU/L (ref 0–40)
Albumin/Globulin Ratio: 1.9 (ref 1.2–2.2)
Albumin: 4.6 g/dL (ref 3.8–4.9)
Alkaline Phosphatase: 61 IU/L (ref 44–121)
BUN/Creatinine Ratio: 14 (ref 9–20)
BUN: 15 mg/dL (ref 6–24)
Bilirubin Total: 0.3 mg/dL (ref 0.0–1.2)
CO2: 22 mmol/L (ref 20–29)
Calcium: 9.4 mg/dL (ref 8.7–10.2)
Chloride: 98 mmol/L (ref 96–106)
Creatinine, Ser: 1.05 mg/dL (ref 0.76–1.27)
Globulin, Total: 2.4 g/dL (ref 1.5–4.5)
Glucose: 271 mg/dL — ABNORMAL HIGH (ref 70–99)
Potassium: 4.5 mmol/L (ref 3.5–5.2)
Sodium: 136 mmol/L (ref 134–144)
Total Protein: 7 g/dL (ref 6.0–8.5)
eGFR: 85 mL/min/{1.73_m2} (ref >59)

## 2022-08-10 LAB — CBC WITH DIFFERENTIAL/PLATELET
Basophils Absolute: 0.1 10*3/uL (ref 0.0–0.2)
Basos: 1 %
EOS (ABSOLUTE): 0.1 10*3/uL (ref 0.0–0.4)
Eos: 2 %
Hematocrit: 43.4 % (ref 37.5–51.0)
Hemoglobin: 14.6 g/dL (ref 13.0–17.7)
Immature Grans (Abs): 0 10*3/uL (ref 0.0–0.1)
Immature Granulocytes: 1 %
Lymphocytes Absolute: 2.9 10*3/uL (ref 0.7–3.1)
Lymphs: 44 %
MCH: 29.3 pg (ref 26.6–33.0)
MCHC: 33.6 g/dL (ref 31.5–35.7)
MCV: 87 fL (ref 79–97)
Monocytes Absolute: 0.4 10*3/uL (ref 0.1–0.9)
Monocytes: 7 %
Neutrophils Absolute: 2.9 10*3/uL (ref 1.4–7.0)
Neutrophils: 45 %
Platelets: 260 10*3/uL (ref 150–450)
RBC: 4.99 x10E6/uL (ref 4.14–5.80)
RDW: 12 % (ref 11.6–15.4)
WBC: 6.3 10*3/uL (ref 3.4–10.8)

## 2022-08-10 LAB — CARDIOVASCULAR RISK ASSESSMENT

## 2022-08-10 LAB — HEMOGLOBIN A1C
Est. average glucose Bld gHb Est-mCnc: 272 mg/dL
Hgb A1c MFr Bld: 11.1 % — ABNORMAL HIGH (ref 4.8–5.6)

## 2022-08-10 LAB — PSA: Prostate Specific Ag, Serum: 0.3 ng/mL (ref 0.0–4.0)

## 2022-08-10 MED ORDER — PIOGLITAZONE HCL 45 MG PO TABS: 45.0000 mg | ORAL_TABLET | Freq: Every day | ORAL | 2 refills | Status: DC

## 2022-08-11 ENCOUNTER — Other Ambulatory Visit: Payer: Self-pay | Admitting: Physician Assistant

## 2022-08-11 DIAGNOSIS — E1165 Type 2 diabetes mellitus with hyperglycemia: Secondary | ICD-10-CM

## 2022-08-11 MED ORDER — RYBELSUS 3 MG PO TABS: 3.0000 mg | ORAL_TABLET | Freq: Every day | ORAL | 0 refills | Status: DC

## 2022-09-22 ENCOUNTER — Other Ambulatory Visit: Payer: Self-pay | Admitting: Physician Assistant

## 2022-09-24 ENCOUNTER — Other Ambulatory Visit: Payer: Self-pay | Admitting: Physician Assistant

## 2022-10-04 ENCOUNTER — Telehealth: Payer: Self-pay

## 2022-10-04 ENCOUNTER — Other Ambulatory Visit: Payer: Self-pay | Admitting: Physician Assistant

## 2022-10-04 MED ORDER — CYCLOBENZAPRINE HCL 5 MG PO TABS: 5.0000 mg | ORAL_TABLET | Freq: Three times a day (TID) | ORAL | 1 refills | Status: AC | PRN

## 2022-10-04 NOTE — Telephone Encounter (Signed)
John Valencia called requesting medication for his back pain.  He reports wrenching his back several days ago.  He has been using ice/heat with no improvement.  He was offered an appointment but he works out of town and is unable to come in.  He is requesting a muscle relaxant be sent in if possible.

## 2022-10-04 NOTE — Telephone Encounter (Signed)
He said thank you and to send it to his regular pharmacy.  He gets home in the evening after 6 pm

## 2022-11-30 ENCOUNTER — Other Ambulatory Visit: Payer: Self-pay | Admitting: Physician Assistant

## 2022-11-30 ENCOUNTER — Other Ambulatory Visit: Payer: Self-pay | Admitting: Family Medicine

## 2022-11-30 DIAGNOSIS — E1165 Type 2 diabetes mellitus with hyperglycemia: Secondary | ICD-10-CM

## 2022-12-01 ENCOUNTER — Other Ambulatory Visit: Payer: Self-pay

## 2022-12-01 DIAGNOSIS — E1165 Type 2 diabetes mellitus with hyperglycemia: Secondary | ICD-10-CM

## 2022-12-01 MED ORDER — RAMIPRIL 2.5 MG PO CAPS: 2.5000 mg | ORAL_CAPSULE | Freq: Every day | ORAL | 0 refills | Status: DC

## 2022-12-01 MED ORDER — PRAVASTATIN SODIUM 10 MG PO TABS: 10.0000 mg | ORAL_TABLET | Freq: Every day | ORAL | 1 refills | Status: DC

## 2022-12-01 MED ORDER — METFORMIN HCL 1000 MG PO TABS: 1000.0000 mg | ORAL_TABLET | Freq: Two times a day (BID) | ORAL | 0 refills | Status: DC

## 2022-12-01 MED ORDER — PIOGLITAZONE HCL 45 MG PO TABS: 45.0000 mg | ORAL_TABLET | Freq: Every day | ORAL | 2 refills | Status: DC

## 2022-12-21 ENCOUNTER — Encounter: Payer: Self-pay | Admitting: Physician Assistant

## 2022-12-21 ENCOUNTER — Ambulatory Visit (INDEPENDENT_AMBULATORY_CARE_PROVIDER_SITE_OTHER): Payer: Self-pay | Admitting: Physician Assistant

## 2022-12-21 VITALS — BP 118/78 | HR 78 | Temp 96.9°F | Ht 72.0 in | Wt 260.2 lb

## 2022-12-21 DIAGNOSIS — E782 Mixed hyperlipidemia: Secondary | ICD-10-CM

## 2022-12-21 DIAGNOSIS — E1165 Type 2 diabetes mellitus with hyperglycemia: Secondary | ICD-10-CM

## 2022-12-21 NOTE — Progress Notes (Signed)
Subjective:  Patient ID: John Valencia, male    DOB: 22-Feb-1971  Age: 52 y.o. MRN: [id number]  Chief Complaint  Patient presents with   Diabetes    HPI  Pt in today for follow up of diabetes.  He does not check sugars at all and has deferred seeing nutritionist.  Last a1c was 11.1- he states he is taking his medications as directed now which is metformin 10529m bid and actos 44mqd - because of cost he is not able to start GLP1 and will not take insulin He has been given patient assistance papers to get rybelsus - will try to send in Pt is due for eye exam Pt defers microalbumin Pt refuses to have foot exam done He is taking altace 2.29m88mnd pravachol 51m7m  Pt with history of GERD -symptoms stable on prevacid 30mg70m Current Outpatient Medications on File Prior to Visit  Medication Sig Dispense Refill   Aspirin-Acetaminophen-Caffeine (EXCEDRIN MIGRAINE PO) Take by mouth.     cyclobenzaprine (FLEXERIL) 5 MG tablet Take 1 tablet (5 mg total) by mouth 3 (three) times daily as needed for muscle spasms. 30 tablet 1   lansoprazole (PREVACID) 30 MG capsule TAKE 1 CAPSULE BY MOUTH DAILY 90 capsule 1   metFORMIN (GLUCOPHAGE) 1000 MG tablet Take 1 tablet (1,000 mg total) by mouth 2 (two) times daily with a meal. 180 tablet 0   pioglitazone (ACTOS) 45 MG tablet Take 1 tablet (45 mg total) by mouth daily. 30 tablet 2   pravastatin (PRAVACHOL) 10 MG tablet Take 1 tablet (10 mg total) by mouth daily. 90 tablet 1   ramipril (ALTACE) 2.5 MG capsule Take 1 capsule (2.5 mg total) by mouth daily. 90 capsule 0   Semaglutide (RYBELSUS) 3 MG TABS Take 3 mg by mouth daily. 30 tablet 0   No current facility-administered medications on file prior to visit.   Past Medical History:  Diagnosis Date   Asthma    GERD (gastroesophageal reflux disease)    Past Surgical History:  Procedure Laterality Date   gerd      Family History  Problem Relation Age of Onset   Cerebrovascular Accident Maternal  Grandmother    Diabetes type II Other    Cancer Other    Social History   Socioeconomic History   Marital status: Widowed    Spouse name: Not on file   Number of children: 3   Years of education: Not on file   Highest education level: Not on file  Occupational History   Occupation: Self employed- [employer] Use   Smoking status: Former    Packs/day: 1.00    Types: Cigarettes   Smokeless tobacco: Never  Vaping Use   Vaping Use: Never used  Substance and Sexual Activity   Alcohol use: No   Drug use: No   Sexual activity: Never  Other Topics Concern   Not on file  Social History Narrative   Not on file   Social Determinants of Health   Financial Resource Strain: Not on file  Food Insecurity: Not on file  Transportation Needs: Not on file  Physical Activity: Not on file  Stress: Not on file  Social Connections: Not on file    Objective:  PHYSICAL EXAM:   VS: BP 118/78 (BP Location: Left Arm, Patient Position: Sitting, Cuff Size: Large)   Pulse 78   Temp (!) 96.9 F (36.1 C) (Temporal)   Ht 6' (1.829 m)   Wt 260  lb 3.2 oz (118 kg)   SpO2 96%   BMI 35.29 kg/m   GEN: Well nourished, well developed, in no acute distress  Cardiac: RRR; no murmurs,  Respiratory:  normal respiratory rate and pattern with no distress - normal breath sounds with no rales, rhonchi, wheezes or rubs Skin: warm and dry, no rash  Psych: euthymic mood, appropriate affect and demeanor   Lab Results  Component Value Date   WBC 6.3 08/09/2022   HGB 14.6 08/09/2022   HCT 43.4 08/09/2022   PLT 260 08/09/2022   GLUCOSE 271 (H) 08/09/2022   CHOL 188 08/09/2022   TRIG 214 (H) 08/09/2022   HDL 39 (L) 08/09/2022   LDLCALC 112 (H) 08/09/2022   ALT 59 (H) 08/09/2022   AST 24 08/09/2022   NA 136 08/09/2022   K 4.5 08/09/2022   CL 98 08/09/2022   CREATININE 1.05 08/09/2022   BUN 15 08/09/2022   CO2 22 08/09/2022   TSH 1.960 09/21/2020   HGBA1C 11.1 (H) 08/09/2022   MICROALBUR 10  08/05/2021      Assessment & Plan:   Problem List Items Addressed This Visit       Digestive   Gastroesophageal reflux disease Continue med     Endocrine   Type 2 diabetes mellitus with hyperglycemia, without long-term current use of insulin (HCC) - Primary   Relevant Orders   CBC with Differential/Platelet   Comprehensive metabolic panel   Lipid panel   Hemoglobin A1c Watch diet better and fill out forms for rybelsus assistance           Mixed hyperlipidemia   Relevant Orders   Lipid panel Continue meds   Other Visit Diagnoses                .  No orders of the defined types were placed in this encounter.   Orders Placed This Encounter  Procedures   Comprehensive metabolic panel   Hemoglobin A1c   Lipid panel     Follow-up: Return in about 6 months (around 06/21/2023) for chronic fasting follow up.  An After Visit Summary was printed and given to the patient.  Yetta Flock [facility] [phone]

## 2022-12-22 LAB — COMPREHENSIVE METABOLIC PANEL
ALT: 46 IU/L — ABNORMAL HIGH (ref 0–44)
AST: 22 IU/L (ref 0–40)
Albumin/Globulin Ratio: 2.3 — ABNORMAL HIGH (ref 1.2–2.2)
Albumin: 4.5 g/dL (ref 3.8–4.9)
Alkaline Phosphatase: 56 IU/L (ref 44–121)
BUN/Creatinine Ratio: 18 (ref 9–20)
BUN: 17 mg/dL (ref 6–24)
Bilirubin Total: 0.3 mg/dL (ref 0.0–1.2)
CO2: 21 mmol/L (ref 20–29)
Calcium: 9 mg/dL (ref 8.7–10.2)
Chloride: 97 mmol/L (ref 96–106)
Creatinine, Ser: 0.94 mg/dL (ref 0.76–1.27)
Globulin, Total: 2 g/dL (ref 1.5–4.5)
Glucose: 220 mg/dL — ABNORMAL HIGH (ref 70–99)
Potassium: 4.8 mmol/L (ref 3.5–5.2)
Sodium: 133 mmol/L — ABNORMAL LOW (ref 134–144)
Total Protein: 6.5 g/dL (ref 6.0–8.5)
eGFR: 98 mL/min/{1.73_m2} (ref >59)

## 2022-12-22 LAB — HEMOGLOBIN A1C
Est. average glucose Bld gHb Est-mCnc: 269 mg/dL
Hgb A1c MFr Bld: 11 % — ABNORMAL HIGH (ref 4.8–5.6)

## 2022-12-22 LAB — LIPID PANEL
Chol/HDL Ratio: 4.6 ratio (ref 0.0–5.0)
Cholesterol, Total: 157 mg/dL (ref 100–199)
HDL: 34 mg/dL — ABNORMAL LOW (ref >39)
LDL Chol Calc (NIH): 87 mg/dL (ref 0–99)
Triglycerides: 211 mg/dL — ABNORMAL HIGH (ref 0–149)
VLDL Cholesterol Cal: 36 mg/dL (ref 5–40)

## 2022-12-22 LAB — CARDIOVASCULAR RISK ASSESSMENT

## 2023-03-04 ENCOUNTER — Other Ambulatory Visit: Payer: Self-pay | Admitting: Physician Assistant

## 2023-03-04 DIAGNOSIS — E1165 Type 2 diabetes mellitus with hyperglycemia: Secondary | ICD-10-CM

## 2023-04-12 ENCOUNTER — Other Ambulatory Visit: Payer: Self-pay | Admitting: Physician Assistant

## 2023-05-02 ENCOUNTER — Other Ambulatory Visit: Payer: Self-pay | Admitting: Physician Assistant

## 2023-05-02 DIAGNOSIS — E1165 Type 2 diabetes mellitus with hyperglycemia: Secondary | ICD-10-CM

## 2023-06-02 ENCOUNTER — Other Ambulatory Visit: Payer: Self-pay | Admitting: Physician Assistant

## 2023-06-02 DIAGNOSIS — E1165 Type 2 diabetes mellitus with hyperglycemia: Secondary | ICD-10-CM

## 2023-06-03 ENCOUNTER — Other Ambulatory Visit: Payer: Self-pay | Admitting: Physician Assistant

## 2023-06-03 DIAGNOSIS — E1165 Type 2 diabetes mellitus with hyperglycemia: Secondary | ICD-10-CM

## 2023-06-05 MED ORDER — PIOGLITAZONE HCL 45 MG PO TABS: 45.0000 mg | ORAL_TABLET | Freq: Every day | ORAL | 0 refills | Status: DC

## 2023-06-05 MED ORDER — RAMIPRIL 2.5 MG PO CAPS: 2.5000 mg | ORAL_CAPSULE | Freq: Every day | ORAL | 0 refills | Status: DC

## 2023-06-05 MED ORDER — METFORMIN HCL 1000 MG PO TABS: 1000.0000 mg | ORAL_TABLET | Freq: Two times a day (BID) | ORAL | 0 refills | Status: DC

## 2023-06-26 ENCOUNTER — Encounter: Payer: Self-pay | Admitting: Physician Assistant

## 2023-06-26 ENCOUNTER — Ambulatory Visit: Payer: Self-pay | Admitting: Physician Assistant

## 2023-06-26 VITALS — BP 120/68 | HR 63 | Temp 97.2°F | Ht 72.0 in | Wt 257.8 lb

## 2023-06-26 DIAGNOSIS — E1165 Type 2 diabetes mellitus with hyperglycemia: Secondary | ICD-10-CM

## 2023-06-26 DIAGNOSIS — E782 Mixed hyperlipidemia: Secondary | ICD-10-CM

## 2023-06-26 DIAGNOSIS — K2100 Gastro-esophageal reflux disease with esophagitis, without bleeding: Secondary | ICD-10-CM

## 2023-06-26 NOTE — Addendum Note (Signed)
Addended by: Marianne Sofia on: 06/26/2023 09:34 AM   Modules accepted: Orders

## 2023-06-26 NOTE — Progress Notes (Addendum)
Subjective:  Patient ID: John Valencia, male    DOB: 08/05/1971  Age: 52 y.o. MRN: [id number]  Chief Complaint  Patient presents with   Medical Management of Chronic Issues    HPI  Pt in today for follow up of diabetes.  He does not check sugars at all and has deferred seeing nutritionist.  Last a1c was 11.0- he states he is taking his medications as directed now which is metformin 1000mg  bid and actos 45mg  qd - because of cost he is not able to afford GLP1 and will not take insulin He has been given patient assistance papers to get rybelsus but he states he was told it was not completed correctly (yet had not mentioned to me until today at follow up appt) Pt is due for eye exam Pt defers microalbumin He is taking altace 2.5mg  and pravachol 10mg  qd  Pt with history of GERD -symptoms stable on prevacid 30mg  qd  Current Outpatient Medications on File Prior to Visit  Medication Sig Dispense Refill   Aspirin-Acetaminophen-Caffeine (EXCEDRIN MIGRAINE PO) Take by mouth.     cyclobenzaprine (FLEXERIL) 5 MG tablet Take 1 tablet (5 mg total) by mouth 3 (three) times daily as needed for muscle spasms. 30 tablet 1   lansoprazole (PREVACID) 30 MG capsule TAKE 1 CAPSULE BY MOUTH DAILY 90 capsule 1   metFORMIN (GLUCOPHAGE) 1000 MG tablet Take 1 tablet (1,000 mg total) by mouth 2 (two) times daily with a meal. 180 tablet 0   pioglitazone (ACTOS) 45 MG tablet TAKE 1 TABLET BY MOUTH DAILY 30 tablet 0   pravastatin (PRAVACHOL) 10 MG tablet Take 1 tablet (10 mg total) by mouth daily. 90 tablet 1   ramipril (ALTACE) 2.5 MG capsule Take 1 capsule (2.5 mg total) by mouth daily. 90 capsule 0   No current facility-administered medications on file prior to visit.   Past Medical History:  Diagnosis Date   Asthma    GERD (gastroesophageal reflux disease)    Past Surgical History:  Procedure Laterality Date   gerd      Family History  Problem Relation Age of Onset   Cerebrovascular Accident Maternal  Grandmother    Diabetes type II Other    Cancer Other    Social History   Socioeconomic History   Marital status: Widowed    Spouse name: Not on file   Number of children: 3   Years of education: Not on file   Highest education level: Not on file  Occupational History   Occupation: Self employed- Curator  Tobacco Use   Smoking status: Former    Current packs/day: 1.00    Types: Cigarettes   Smokeless tobacco: Never  Vaping Use   Vaping status: Never Used  Substance and Sexual Activity   Alcohol use: No   Drug use: No   Sexual activity: Never  Other Topics Concern   Not on file  Social History Narrative   Not on file   Social Determinants of Health   Financial Resource Strain: Low Risk  (06/26/2023)   Overall Financial Resource Strain (CARDIA)    Difficulty of Paying Living Expenses: Not hard at all  Food Insecurity: No Food Insecurity (06/26/2023)   Hunger Vital Sign    Worried About Running Out of Food in the Last Year: Never true    Ran Out of Food in the Last Year: Never true  Transportation Needs: No Transportation Needs (06/26/2023)   PRAPARE - Transportation    Lack of Transportation (  Medical): No    Lack of Transportation (Non-Medical): No  Physical Activity: Sufficiently Active (06/26/2023)   Exercise Vital Sign    Days of Exercise per Week: 5 days    Minutes of Exercise per Session: 30 min  Stress: No Stress Concern Present (06/26/2023)   [employer] of Occupational Health - Occupational Stress Questionnaire    Feeling of Stress : Not at all  Social Connections: Socially Isolated (06/26/2023)   Social Connection and Isolation Panel [NHANES]    Frequency of Communication with Friends and Family: More than three times a week    Frequency of Social Gatherings with Friends and Family: Three times a week    Attends Religious Services: Never    Active Member of Clubs or Organizations: No    Attends Banker Meetings: Never    Marital Status:  Widowed  CONSTITUTIONAL: Negative for chills, fatigue, fever, unintentional weight gain and unintentional weight loss.  CARDIOVASCULAR: Negative for chest pain, dizziness, palpitations and pedal edema.  RESPIRATORY: Negative for recent cough and dyspnea.  GASTROINTESTINAL: Negative for abdominal pain, acid reflux symptoms, constipation, diarrhea, nausea and vomiting.  MSK: Negative for arthralgias and myalgias.  INTEGUMENTARY: Negative for rash.     Objective:  PHYSICAL EXAM:   VS: BP 120/68 (BP Location: Left Arm, Patient Position: Sitting, Cuff Size: Large)   Pulse 63   Temp (!) 97.2 F (36.2 C) (Temporal)   Ht 6' (1.829 m)   Wt 257 lb 12.8 oz (116.9 kg)   SpO2 97%   BMI 34.96 kg/m   GEN: Well nourished, well developed, in no acute distress  Cardiac: RRR; no murmurs, rubs, Respiratory:  normal respiratory rate and pattern with no distress - normal breath sounds with no rales, rhonchi, wheezes or rubs  MS: no deformity or atrophy  Skin: warm and dry, no rash   Psych: euthymic mood, appropriate affect and demeanor   Lab Results  Component Value Date   WBC 6.3 08/09/2022   HGB 14.6 08/09/2022   HCT 43.4 08/09/2022   PLT 260 08/09/2022   GLUCOSE 220 (H) 12/21/2022   CHOL 157 12/21/2022   TRIG 211 (H) 12/21/2022   HDL 34 (L) 12/21/2022   LDLCALC 87 12/21/2022   ALT 46 (H) 12/21/2022   AST 22 12/21/2022   NA 133 (L) 12/21/2022   K 4.8 12/21/2022   CL 97 12/21/2022   CREATININE 0.94 12/21/2022   BUN 17 12/21/2022   CO2 21 12/21/2022   TSH 1.960 09/21/2020   HGBA1C 11.0 (H) 12/21/2022   MICROALBUR 10 08/05/2021      Assessment & Plan:   Problem List Items Addressed This Visit       Digestive   Gastroesophageal reflux disease Continue med     Endocrine   Type 2 diabetes mellitus with hyperglycemia, without long-term current use of insulin (HCC) - Primary   Relevant Orders   CBC with Differential/Platelet   Comprehensive metabolic panel   Lipid panel    Hemoglobin A1c Watch diet better and will inquire about rybelsus           Mixed hyperlipidemia   Relevant Orders   Lipid panel Continue meds   Other Visit Diagnoses                .  No orders of the defined types were placed in this encounter.   Orders Placed This Encounter  Procedures   CBC with Differential/Platelet   Comprehensive metabolic panel   Lipid panel  Hemoglobin A1c   Microalbumin / creatinine urine ratio     Follow-up: Return in about 4 months (around 10/26/2023) for chronic fasting follow-up.  An After Visit Summary was printed and given to the patient.  Jettie Pagan [facility] [phone]

## 2023-06-27 LAB — HEMOGLOBIN A1C
Est. average glucose Bld gHb Est-mCnc: 235 mg/dL
Hgb A1c MFr Bld: 9.8 % — ABNORMAL HIGH (ref 4.8–5.6)

## 2023-06-27 LAB — LIPID PANEL
Chol/HDL Ratio: 4.8 ratio (ref 0.0–5.0)
Cholesterol, Total: 163 mg/dL (ref 100–199)
HDL: 34 mg/dL — ABNORMAL LOW (ref >39)
LDL Chol Calc (NIH): 97 mg/dL (ref 0–99)
Triglycerides: 184 mg/dL — ABNORMAL HIGH (ref 0–149)
VLDL Cholesterol Cal: 32 mg/dL (ref 5–40)

## 2023-06-27 LAB — COMPREHENSIVE METABOLIC PANEL
ALT: 41 IU/L (ref 0–44)
AST: 21 IU/L (ref 0–40)
Albumin: 4.5 g/dL (ref 3.8–4.9)
Alkaline Phosphatase: 50 IU/L (ref 44–121)
BUN/Creatinine Ratio: 15 (ref 9–20)
BUN: 15 mg/dL (ref 6–24)
Bilirubin Total: 0.4 mg/dL (ref 0.0–1.2)
CO2: 24 mmol/L (ref 20–29)
Calcium: 9.2 mg/dL (ref 8.7–10.2)
Chloride: 99 mmol/L (ref 96–106)
Creatinine, Ser: 0.99 mg/dL (ref 0.76–1.27)
Globulin, Total: 2.1 g/dL (ref 1.5–4.5)
Glucose: 187 mg/dL — ABNORMAL HIGH (ref 70–99)
Potassium: 4.7 mmol/L (ref 3.5–5.2)
Sodium: 138 mmol/L (ref 134–144)
Total Protein: 6.6 g/dL (ref 6.0–8.5)
eGFR: 91 mL/min/{1.73_m2} (ref >59)

## 2023-06-27 LAB — CBC WITH DIFFERENTIAL/PLATELET
Basophils Absolute: 0.1 10*3/uL (ref 0.0–0.2)
Basos: 1 %
EOS (ABSOLUTE): 0.2 10*3/uL (ref 0.0–0.4)
Eos: 2 %
Hematocrit: 42.3 % (ref 37.5–51.0)
Hemoglobin: 14.1 g/dL (ref 13.0–17.7)
Immature Grans (Abs): 0 10*3/uL (ref 0.0–0.1)
Immature Granulocytes: 0 %
Lymphocytes Absolute: 3.5 10*3/uL — ABNORMAL HIGH (ref 0.7–3.1)
Lymphs: 48 %
MCH: 29.4 pg (ref 26.6–33.0)
MCHC: 33.3 g/dL (ref 31.5–35.7)
MCV: 88 fL (ref 79–97)
Monocytes Absolute: 0.5 10*3/uL (ref 0.1–0.9)
Monocytes: 6 %
Neutrophils Absolute: 3.1 10*3/uL (ref 1.4–7.0)
Neutrophils: 43 %
Platelets: 277 10*3/uL (ref 150–450)
RBC: 4.8 x10E6/uL (ref 4.14–5.80)
RDW: 12.1 % (ref 11.6–15.4)
WBC: 7.3 10*3/uL (ref 3.4–10.8)

## 2023-06-27 LAB — MICROALBUMIN / CREATININE URINE RATIO
Creatinine, Urine: 153 mg/dL
Microalb/Creat Ratio: 6 mg/g{creat} (ref 0–29)
Microalbumin, Urine: 9.2 ug/mL

## 2023-07-03 ENCOUNTER — Telehealth: Payer: Self-pay

## 2023-07-03 NOTE — Telephone Encounter (Signed)
Left message for patient pick-up.  Rybelsus 3mg . NDC [phone].  Rybelsus 7 mg  NDC [id number].

## 2023-07-03 NOTE — Telephone Encounter (Signed)
[employer] Notification:  Patient approved for Enrollment with [employer] Patient Assistance Program for Rybelsus, enrollment will end on 06/22/2024.

## 2023-07-14 ENCOUNTER — Other Ambulatory Visit: Payer: Self-pay | Admitting: Physician Assistant

## 2023-10-19 ENCOUNTER — Other Ambulatory Visit: Payer: Self-pay | Admitting: Physician Assistant

## 2023-10-19 DIAGNOSIS — E1165 Type 2 diabetes mellitus with hyperglycemia: Secondary | ICD-10-CM

## 2023-10-26 ENCOUNTER — Ambulatory Visit: Payer: Self-pay | Admitting: Physician Assistant

## 2023-10-26 VITALS — BP 130/80 | HR 75 | Temp 97.3°F | Resp 14 | Ht 72.0 in | Wt 254.0 lb

## 2023-10-26 DIAGNOSIS — Z125 Encounter for screening for malignant neoplasm of prostate: Secondary | ICD-10-CM

## 2023-10-26 DIAGNOSIS — E1165 Type 2 diabetes mellitus with hyperglycemia: Secondary | ICD-10-CM

## 2023-10-26 DIAGNOSIS — E782 Mixed hyperlipidemia: Secondary | ICD-10-CM

## 2023-10-26 NOTE — Progress Notes (Signed)
Subjective:  Patient ID: John Valencia, male    DOB: 03-02-1971  Age: 52 y.o. MRN: [id number]  Chief Complaint  Patient presents with   Diabetes    HPI  Pt in today for follow up of diabetes.  He does not check sugars at all and has deferred seeing nutritionist.  Last a1c was 9.8- he states he is taking his medications as directed now which is metformin 1000mg  bid and actos 45mg  qd - he has been approved for rybelsus and go through patient assistance but had not yet picked up - will give pt today Pt is due for eye exam Pt defers microalbumin He is taking altace 2.5mg  and pravachol 10mg  qd  Pt with history of GERD -symptoms stable on prevacid 30mg  qd  Current Outpatient Medications on File Prior to Visit  Medication Sig Dispense Refill   Aspirin-Acetaminophen-Caffeine (EXCEDRIN MIGRAINE PO) Take by mouth.     cyclobenzaprine (FLEXERIL) 5 MG tablet Take 1 tablet (5 mg total) by mouth 3 (three) times daily as needed for muscle spasms. 30 tablet 1   lansoprazole (PREVACID) 30 MG capsule TAKE 1 CAPSULE BY MOUTH DAILY 90 capsule 0   metFORMIN (GLUCOPHAGE) 1000 MG tablet Take 1 tablet (1,000 mg total) by mouth 2 (two) times daily with a meal. 180 tablet 0   pioglitazone (ACTOS) 45 MG tablet TAKE 1 TABLET BY MOUTH DAILY 90 tablet 0   pravastatin (PRAVACHOL) 10 MG tablet TAKE 1 TABLET BY MOUTH DAILY 90 tablet 1   ramipril (ALTACE) 2.5 MG capsule Take 1 capsule (2.5 mg total) by mouth daily. 90 capsule 0   Semaglutide (RYBELSUS) 3 MG TABS Take by mouth.     No current facility-administered medications on file prior to visit.   Past Medical History:  Diagnosis Date   Asthma    GERD (gastroesophageal reflux disease)    Past Surgical History:  Procedure Laterality Date   gerd      Family History  Problem Relation Age of Onset   Cerebrovascular Accident Maternal Grandmother    Diabetes type II Other    Cancer Other    Social History   Socioeconomic History   Marital status:  Widowed    Spouse name: Not on file   Number of children: 3   Years of education: Not on file   Highest education level: GED or equivalent  Occupational History   Occupation: Self employedMedical laboratory scientific officer  Tobacco Use   Smoking status: Former    Current packs/day: 1.00    Types: Cigarettes   Smokeless tobacco: Never  Vaping Use   Vaping status: Never Used  Substance and Sexual Activity   Alcohol use: No   Drug use: No   Sexual activity: Not Currently  Other Topics Concern   Not on file  Social History Narrative   Not on file   Social Drivers of Health   Financial Resource Strain: Patient Declined (10/26/2023)   Overall Financial Resource Strain (CARDIA)    Difficulty of Paying Living Expenses: Patient declined  Food Insecurity: Patient Declined (10/26/2023)   Hunger Vital Sign    Worried About Running Out of Food in the Last Year: Patient declined    Ran Out of Food in the Last Year: Patient declined  Transportation Needs: Unknown (10/26/2023)   PRAPARE - Transportation    Lack of Transportation (Medical): No    Lack of Transportation (Non-Medical): Patient declined  Physical Activity: Unknown (10/26/2023)   Exercise Vital Sign    Days of  Exercise per Week: 7 days    Minutes of Exercise per Session: Patient declined  Stress: Patient Declined (10/26/2023)   [employer] of Occupational Health - Occupational Stress Questionnaire    Feeling of Stress : Patient declined  Social Connections: Unknown (10/26/2023)   Social Connection and Isolation Panel [NHANES]    Frequency of Communication with Friends and Family: More than three times a week    Frequency of Social Gatherings with Friends and Family: More than three times a week    Attends Religious Services: Patient declined    Database administrator or Organizations: Yes    Attends Banker Meetings: Patient declined    Marital Status: Widowed  CONSTITUTIONAL: Negative for chills, fatigue, fever,  unintentional weight gain and unintentional weight loss.   CARDIOVASCULAR: Negative for chest pain, dizziness, palpitations and pedal edema.  RESPIRATORY: Negative for recent cough and dyspnea.  GASTROINTESTINAL: Negative for abdominal pain, acid reflux symptoms, constipation, diarrhea, nausea and vomiting.  MSK: Negative for arthralgias and myalgias.  INTEGUMENTARY: Negative for rash.   PSYCHIATRIC: Negative for sleep disturbance and to question depression screen.  Negative for depression, negative for anhedonia.        Objective:  PHYSICAL EXAM:   VS: BP 130/80   Pulse 75   Temp (!) 97.3 F (36.3 C)   Resp 14   Ht 6' (1.829 m)   Wt 254 lb (115.2 kg)   SpO2 95%   BMI 34.45 kg/m   GEN: Well nourished, well developed, in no acute distress  Cardiac: RRR; no murmurs, rubs, or gallops,no edema -  Respiratory:  normal respiratory rate and pattern with no distress - normal breath sounds with no rales, rhonchi, wheezes or rubs  MS: no deformity or atrophy  Skin: warm and dry, no rash  Neuro:  Alert and Oriented x 3, - CN II-Xii grossly intact Psych: euthymic mood, appropriate affect and demeanor    Lab Results  Component Value Date   WBC 7.3 06/26/2023   HGB 14.1 06/26/2023   HCT 42.3 06/26/2023   PLT 277 06/26/2023   GLUCOSE 187 (H) 06/26/2023   CHOL 163 06/26/2023   TRIG 184 (H) 06/26/2023   HDL 34 (L) 06/26/2023   LDLCALC 97 06/26/2023   ALT 41 06/26/2023   AST 21 06/26/2023   NA 138 06/26/2023   K 4.7 06/26/2023   CL 99 06/26/2023   CREATININE 0.99 06/26/2023   BUN 15 06/26/2023   CO2 24 06/26/2023   TSH 1.960 09/21/2020   HGBA1C 9.8 (H) 06/26/2023   MICROALBUR 10 08/05/2021      Assessment & Plan:   Problem List Items Addressed This Visit       Digestive   Gastroesophageal reflux disease Continue med     Endocrine   Type 2 diabetes mellitus with hyperglycemia, without long-term current use of insulin (HCC) - Primary   Relevant Orders   CBC  with Differential/Platelet   Comprehensive metabolic panel   Lipid panel   Hemoglobin A1c Watch diet better and start rybelsus           Mixed hyperlipidemia   Relevant Orders   Lipid panel Continue meds   Other Visit Diagnoses                .  No orders of the defined types were placed in this encounter.   Orders Placed This Encounter  Procedures   Comprehensive metabolic panel   Lipid panel   Hemoglobin A1c  PSA     Follow-up: Return in about 4 months (around 02/24/2024) for chronic fasting follow-up.  An After Visit Summary was printed and given to the patient.  Jettie Pagan [facility] [phone]

## 2023-10-27 LAB — LIPID PANEL
Chol/HDL Ratio: 5.3 {ratio} — ABNORMAL HIGH (ref 0.0–5.0)
Cholesterol, Total: 196 mg/dL (ref 100–199)
HDL: 37 mg/dL — ABNORMAL LOW (ref >39)
LDL Chol Calc (NIH): 123 mg/dL — ABNORMAL HIGH (ref 0–99)
Triglycerides: 202 mg/dL — ABNORMAL HIGH (ref 0–149)
VLDL Cholesterol Cal: 36 mg/dL (ref 5–40)

## 2023-10-27 LAB — COMPREHENSIVE METABOLIC PANEL
ALT: 54 [IU]/L — ABNORMAL HIGH (ref 0–44)
AST: 22 [IU]/L (ref 0–40)
Albumin: 4.5 g/dL (ref 3.8–4.9)
Alkaline Phosphatase: 60 [IU]/L (ref 44–121)
BUN/Creatinine Ratio: 15 (ref 9–20)
BUN: 15 mg/dL (ref 6–24)
Bilirubin Total: 0.3 mg/dL (ref 0.0–1.2)
CO2: 22 mmol/L (ref 20–29)
Calcium: 9.1 mg/dL (ref 8.7–10.2)
Chloride: 98 mmol/L (ref 96–106)
Creatinine, Ser: 0.97 mg/dL (ref 0.76–1.27)
Globulin, Total: 2.2 g/dL (ref 1.5–4.5)
Glucose: 244 mg/dL — ABNORMAL HIGH (ref 70–99)
Potassium: 4.6 mmol/L (ref 3.5–5.2)
Sodium: 137 mmol/L (ref 134–144)
Total Protein: 6.7 g/dL (ref 6.0–8.5)
eGFR: 93 mL/min/{1.73_m2} (ref >59)

## 2023-10-27 LAB — HEMOGLOBIN A1C
Est. average glucose Bld gHb Est-mCnc: 269 mg/dL
Hgb A1c MFr Bld: 11 % — ABNORMAL HIGH (ref 4.8–5.6)

## 2023-10-27 LAB — PSA: Prostate Specific Ag, Serum: 0.3 ng/mL (ref 0.0–4.0)

## 2023-10-28 ENCOUNTER — Other Ambulatory Visit: Payer: Self-pay | Admitting: Physician Assistant

## 2023-11-27 ENCOUNTER — Other Ambulatory Visit: Payer: Self-pay | Admitting: Physician Assistant

## 2023-12-25 ENCOUNTER — Telehealth: Payer: Self-pay

## 2023-12-25 NOTE — Telephone Encounter (Signed)
 Spoke to patient who stated he took his last pill today. Informed him we do not have any PAP here for him to pick up. Provided him with the number to [employer] to call. Patient stated he will call once and if he does not get any help that he will just d/c the medication. Informed patient should he not get someone on the phone with [employer] to call our office again and I would have someone who helps handle patient assistance (will send a message to [facility]) to see if they can help. Patient verbalized understanding and agreed.   Copied from CRM [id number]. Topic: Clinical - Medication Refill >> Dec 25, 2023 11:58 AM Phill Myron wrote: Most Recent Primary Care Visit:  Provider: Marianne Sofia  Department: [facility]  Visit Type: OFFICE VISIT  Date: 10/26/2023  Medication: Semaglutide (RYBELSUS) 3 MG TABS  Patient states he picks this medication up at the office because he can not afford it.   Is the patient out of the medication? Yes  Has the patient been seen for an appointment in the last year OR does the patient have an upcoming appointment? Yes

## 2023-12-26 ENCOUNTER — Telehealth: Payer: Self-pay

## 2023-12-26 NOTE — Telephone Encounter (Signed)
 Patient aware that our office has completed refill paperwork and faxed to [employer]. I told him you would call him back regarding picking up samples (if that is what Kennon Rounds suggest he do until PAP arrives)  Copied from CRM [id number]. Topic: Clinical - Prescription Issue >> Dec 25, 2023  5:17 PM Shelah Lewandowsky wrote: Reason for CRM: has not heard back from office about medication, wants to speak with Marianne Sofia - please call patient [phone]

## 2023-12-30 LAB — HEMOGLOBIN A1C: A1c: 8.9

## 2024-01-01 ENCOUNTER — Other Ambulatory Visit: Payer: Self-pay | Admitting: Physician Assistant

## 2024-01-01 ENCOUNTER — Telehealth: Payer: Self-pay

## 2024-01-01 NOTE — Telephone Encounter (Signed)
 Pt called back requesting to speak to the office regarding his requests from Friday. Says he needs to be called back within 30 minutes. Tried calling [facility]

## 2024-01-01 NOTE — Telephone Encounter (Signed)
 Spoke with patient, needs reorder form sent over to [employer] for Rybelsus.  Faxed form to Marianne Sofia for review.

## 2024-01-01 NOTE — Telephone Encounter (Signed)
 Patient notified [facility] [phone] form has been faxed

## 2024-01-01 NOTE — Telephone Encounter (Signed)
 Please call patient in regard to patient assistance for Rybelsus.

## 2024-01-09 ENCOUNTER — Telehealth: Payer: Self-pay

## 2024-01-09 ENCOUNTER — Other Ambulatory Visit: Payer: Self-pay

## 2024-01-09 NOTE — Telephone Encounter (Signed)
 Patient called he is out of his rybelsus, he has been waiting for a refill to be sent to [employer] patient assistant.  Made patient aware refill was sent on 01/02/24, per Marianne Sofia, PA-C its okay to give patient samples of rybelsys 3 mg and patient take 3 tablets daily.  Patient made aware. Will come by office later NDC: [phone] Lot: [id number] EXP: 10/31/205

## 2024-01-17 ENCOUNTER — Other Ambulatory Visit: Payer: Self-pay | Admitting: Physician Assistant

## 2024-01-17 DIAGNOSIS — E1165 Type 2 diabetes mellitus with hyperglycemia: Secondary | ICD-10-CM

## 2024-02-14 ENCOUNTER — Telehealth: Payer: Self-pay

## 2024-02-14 NOTE — Telephone Encounter (Signed)
 Patient Assistance was received for Rybelsus 7 mg. Called the patient and informed him that it has been received.  Rybelsus 7 mg 4 boxes  NDC# [phone] Lot# [id number] EXP: 02/04/25

## 2024-02-28 ENCOUNTER — Other Ambulatory Visit: Payer: Self-pay | Admitting: Family Medicine

## 2024-02-29 ENCOUNTER — Ambulatory Visit (INDEPENDENT_AMBULATORY_CARE_PROVIDER_SITE_OTHER): Payer: Self-pay | Admitting: Physician Assistant

## 2024-02-29 ENCOUNTER — Encounter: Payer: Self-pay | Admitting: Physician Assistant

## 2024-02-29 VITALS — BP 108/72 | HR 62 | Temp 98.2°F | Resp 18 | Ht 72.0 in | Wt 237.6 lb

## 2024-02-29 DIAGNOSIS — E1165 Type 2 diabetes mellitus with hyperglycemia: Secondary | ICD-10-CM

## 2024-02-29 DIAGNOSIS — E782 Mixed hyperlipidemia: Secondary | ICD-10-CM

## 2024-02-29 NOTE — Telephone Encounter (Signed)
 Patient pick up Rybelsus  7 Mg today at his appointment.

## 2024-02-29 NOTE — Progress Notes (Signed)
 Subjective:  Patient ID: John Valencia, male    DOB: Jan 17, 1971  Age: 53 y.o. MRN: [id number]  Chief Complaint  Patient presents with   Medical Management of Chronic Issues    HPI  Pt in today for follow up of diabetes.  He does not check sugars at all and has deferred seeing nutritionist.  He has been on a mediterranean diet since last visit and lost about 20 pounds.  He is eating high protein and high fiber and is baking and grilling most of his food.  He also has been compliant with medications and taking glucophage , actos  and rybelsus  Pt is due for eye exam He is taking altace  2.5mg  and pravachol  10mg  qd  Pt with history of GERD -symptoms stable on prevacid  30mg  qd  Current Outpatient Medications on File Prior to Visit  Medication Sig Dispense Refill   Aspirin-Acetaminophen -Caffeine (EXCEDRIN MIGRAINE PO) Take by mouth.     cyclobenzaprine  (FLEXERIL ) 5 MG tablet Take 1 tablet (5 mg total) by mouth 3 (three) times daily as needed for muscle spasms. 30 tablet 1   lansoprazole  (PREVACID ) 30 MG capsule TAKE 1 CAPSULE BY MOUTH DAILY 90 capsule 0   metFORMIN  (GLUCOPHAGE ) 1000 MG tablet TAKE 1 TABLET BY MOUTH TWICE DAILY WITH A MEAL 180 tablet 0   pioglitazone  (ACTOS ) 45 MG tablet TAKE 1 TABLET BY MOUTH DAILY 90 tablet 0   pravastatin  (PRAVACHOL ) 10 MG tablet TAKE 1 TABLET BY MOUTH DAILY 90 tablet 0   ramipril  (ALTACE ) 2.5 MG capsule TAKE 1 CAPSULE BY MOUTH DAILY 90 capsule 0   Semaglutide  (RYBELSUS ) 7 MG TABS Take by mouth.     No current facility-administered medications on file prior to visit.   Past Medical History:  Diagnosis Date   Asthma    GERD (gastroesophageal reflux disease)    Past Surgical History:  Procedure Laterality Date   gerd      Family History  Problem Relation Age of Onset   Cerebrovascular Accident Maternal Grandmother    Diabetes type II Other    Cancer Other    Social History   Socioeconomic History   Marital status: Widowed    Spouse name: Not  on file   Number of children: 3   Years of education: Not on file   Highest education level: GED or equivalent  Occupational History   Occupation: Self employedMedical laboratory scientific officer  Tobacco Use   Smoking status: Former    Current packs/day: 1.00    Types: Cigarettes   Smokeless tobacco: Never  Vaping Use   Vaping status: Never Used  Substance and Sexual Activity   Alcohol use: No   Drug use: No   Sexual activity: Not Currently  Other Topics Concern   Not on file  Social History Narrative   Not on file   Social Drivers of Health   Financial Resource Strain: Patient Declined (10/26/2023)   Overall Financial Resource Strain (CARDIA)    Difficulty of Paying Living Expenses: Patient declined  Food Insecurity: Patient Declined (10/26/2023)   Hunger Vital Sign    Worried About Running Out of Food in the Last Year: Patient declined    Ran Out of Food in the Last Year: Patient declined  Transportation Needs: Unknown (10/26/2023)   PRAPARE - Transportation    Lack of Transportation (Medical): No    Lack of Transportation (Non-Medical): Patient declined  Physical Activity: Unknown (10/26/2023)   Exercise Vital Sign    Days of Exercise per Week: 7 days  Minutes of Exercise per Session: Patient declined  Stress: Patient Declined (10/26/2023)   [employer] of Occupational Health - Occupational Stress Questionnaire    Feeling of Stress : Patient declined  Social Connections: Unknown (10/26/2023)   Social Connection and Isolation Panel [NHANES]    Frequency of Communication with Friends and Family: More than three times a week    Frequency of Social Gatherings with Friends and Family: More than three times a week    Attends Religious Services: Patient declined    Database administrator or Organizations: Yes    Attends Banker Meetings: Patient declined    Marital Status: Widowed  CONSTITUTIONAL: Negative for chills, fatigue, fever, unintentional weight gain and  unintentional weight loss.  E/N/T: Negative for ear pain, nasal congestion and sore throat.  CARDIOVASCULAR: Negative for chest pain, dizziness, palpitations and pedal edema.  RESPIRATORY: Negative for recent cough and dyspnea.  GASTROINTESTINAL: Negative for abdominal pain, acid reflux symptoms, constipation, diarrhea, nausea and vomiting.  MSK: Negative for arthralgias and myalgias.  INTEGUMENTARY: Negative for rash.  NEUROLOGICAL: Negative for dizziness and headaches.  PSYCHIATRIC: Negative for sleep disturbance and to question depression screen.  Negative for depression, negative for anhedonia.        Objective:  PHYSICAL EXAM:   VS: BP 108/72   Pulse 62   Temp 98.2 F (36.8 C) (Temporal)   Resp 18   Ht 6' (1.829 m)   Wt 237 lb 9.6 oz (107.8 kg)   SpO2 96%   BMI 32.22 kg/m   GEN: Well nourished, well developed, in no acute distress   Cardiac: RRR; no murmurs, rubs, or gallops,no edema - Respiratory:  normal respiratory rate and pattern with no distress - normal breath sounds with no rales, rhonchi, wheezes or rubs  Skin: warm and dry, no rash  Neuro:  Alert and Oriented x 3, - CN II-Xii grossly intact Psych: euthymic mood, appropriate affect and demeanor  Lab Results  Component Value Date   WBC 7.3 06/26/2023   HGB 14.1 06/26/2023   HCT 42.3 06/26/2023   PLT 277 06/26/2023   GLUCOSE 244 (H) 10/26/2023   CHOL 196 10/26/2023   TRIG 202 (H) 10/26/2023   HDL 37 (L) 10/26/2023   LDLCALC 123 (H) 10/26/2023   ALT 54 (H) 10/26/2023   AST 22 10/26/2023   NA 137 10/26/2023   K 4.6 10/26/2023   CL 98 10/26/2023   CREATININE 0.97 10/26/2023   BUN 15 10/26/2023   CO2 22 10/26/2023   TSH 1.960 09/21/2020   HGBA1C 11.0 (H) 10/26/2023   MICROALBUR 10 08/05/2021      Assessment & Plan:   Problem List Items Addressed This Visit       Digestive   Gastroesophageal reflux disease Continue med     Endocrine   Type 2 diabetes mellitus with hyperglycemia, without  long-term current use of insulin (HCC) - Primary   Relevant Orders   CBC with Differential/Platelet   Comprehensive metabolic panel   Lipid panel   Hemoglobin A1c Continue to watch diet and take meds as directed           Mixed hyperlipidemia   Relevant Orders   Lipid panel Continue meds   Other Visit Diagnoses                .  No orders of the defined types were placed in this encounter.   Orders Placed This Encounter  Procedures   Comprehensive metabolic panel with  GFR   Lipid panel   Hemoglobin A1c     Follow-up: Return in about 4 months (around 06/30/2024) for chronic fasting follow-up.  An After Visit Summary was printed and given to the patient.  Anthonette Bastos [facility] [phone]

## 2024-03-01 ENCOUNTER — Encounter: Payer: Self-pay | Admitting: Physician Assistant

## 2024-03-01 LAB — COMPREHENSIVE METABOLIC PANEL WITH GFR
ALT: 31 IU/L (ref 0–44)
AST: 21 IU/L (ref 0–40)
Albumin: 4.5 g/dL (ref 3.8–4.9)
Alkaline Phosphatase: 45 IU/L (ref 44–121)
BUN/Creatinine Ratio: 14 (ref 9–20)
BUN: 16 mg/dL (ref 6–24)
Bilirubin Total: 0.4 mg/dL (ref 0.0–1.2)
CO2: 23 mmol/L (ref 20–29)
Calcium: 9.1 mg/dL (ref 8.7–10.2)
Chloride: 102 mmol/L (ref 96–106)
Creatinine, Ser: 1.11 mg/dL (ref 0.76–1.27)
Globulin, Total: 2.2 g/dL (ref 1.5–4.5)
Glucose: 107 mg/dL — ABNORMAL HIGH (ref 70–99)
Potassium: 4.5 mmol/L (ref 3.5–5.2)
Sodium: 139 mmol/L (ref 134–144)
Total Protein: 6.7 g/dL (ref 6.0–8.5)
eGFR: 79 mL/min/{1.73_m2} (ref >59)

## 2024-03-01 LAB — LIPID PANEL
Chol/HDL Ratio: 3.8 ratio (ref 0.0–5.0)
Cholesterol, Total: 149 mg/dL (ref 100–199)
HDL: 39 mg/dL — ABNORMAL LOW (ref >39)
LDL Chol Calc (NIH): 90 mg/dL (ref 0–99)
Triglycerides: 111 mg/dL (ref 0–149)
VLDL Cholesterol Cal: 20 mg/dL (ref 5–40)

## 2024-03-01 LAB — HEMOGLOBIN A1C
Est. average glucose Bld gHb Est-mCnc: 154 mg/dL
Hgb A1c MFr Bld: 7 % — ABNORMAL HIGH (ref 4.8–5.6)

## 2024-03-29 ENCOUNTER — Other Ambulatory Visit: Payer: Self-pay | Admitting: Physician Assistant

## 2024-04-18 ENCOUNTER — Other Ambulatory Visit: Payer: Self-pay | Admitting: Physician Assistant

## 2024-04-18 DIAGNOSIS — E1165 Type 2 diabetes mellitus with hyperglycemia: Secondary | ICD-10-CM

## 2024-06-03 ENCOUNTER — Telehealth: Payer: Self-pay

## 2024-06-03 NOTE — Telephone Encounter (Signed)
 Called and informed patient of patient assistance that was received today at the office and patient under stood verbally.  Received Rybelsus  7 mg - 4 boxes   NDC# [id number] Lot# [id number] Exp: 06/06/25

## 2024-07-06 ENCOUNTER — Other Ambulatory Visit: Payer: Self-pay | Admitting: Physician Assistant

## 2024-07-09 ENCOUNTER — Other Ambulatory Visit: Payer: Self-pay | Admitting: Physician Assistant

## 2024-07-09 MED ORDER — RAMIPRIL 2.5 MG PO CAPS: 2.5000 mg | ORAL_CAPSULE | Freq: Every day | ORAL | 0 refills | Status: DC

## 2024-07-16 ENCOUNTER — Ambulatory Visit (INDEPENDENT_AMBULATORY_CARE_PROVIDER_SITE_OTHER): Payer: Self-pay | Admitting: Physician Assistant

## 2024-07-16 VITALS — BP 106/68 | HR 66 | Temp 98.3°F | Resp 18 | Ht 72.0 in | Wt 245.0 lb

## 2024-07-16 DIAGNOSIS — E782 Mixed hyperlipidemia: Secondary | ICD-10-CM

## 2024-07-16 DIAGNOSIS — E1165 Type 2 diabetes mellitus with hyperglycemia: Secondary | ICD-10-CM

## 2024-07-16 NOTE — Progress Notes (Signed)
 Subjective:  Patient ID: John Valencia, male    DOB: [birth year]  Age: 53 y.o. MRN: [id number]  Chief Complaint  Patient presents with   Medical Management of Chronic Issues    HPI  Pt in today for follow up of diabetes.  He does not check sugars at all and has deferred seeing nutritionist.  Pt currently taking metformin , actos  and rybelsus  7mg  Pt is due for eye exam He is taking altace  2.5mg  and pravachol  10mg  qd  Pt with history of GERD -symptoms stable on prevacid  30mg  qd  Pt defers immunizations and does not want TSH drawn today Current Outpatient Medications on File Prior to Visit  Medication Sig Dispense Refill   Aspirin-Acetaminophen -Caffeine (EXCEDRIN MIGRAINE PO) Take by mouth.     cyclobenzaprine  (FLEXERIL ) 5 MG tablet Take 1 tablet (5 mg total) by mouth 3 (three) times daily as needed for muscle spasms. 30 tablet 1   lansoprazole  (PREVACID ) 30 MG capsule TAKE 1 CAPSULE BY MOUTH DAILY 90 capsule 0   metFORMIN  (GLUCOPHAGE ) 1000 MG tablet TAKE 1 TABLET BY MOUTH TWICE DAILY WITH A MEAL 180 tablet 0   pioglitazone  (ACTOS ) 45 MG tablet TAKE 1 TABLET BY MOUTH DAILY 90 tablet 0   pravastatin  (PRAVACHOL ) 10 MG tablet TAKE 1 TABLET BY MOUTH DAILY 90 tablet 0   ramipril  (ALTACE ) 2.5 MG capsule Take 1 capsule (2.5 mg total) by mouth daily. 90 capsule 0   Semaglutide  (RYBELSUS ) 7 MG TABS Take by mouth.     No current facility-administered medications on file prior to visit.   Past Medical History:  Diagnosis Date   Asthma    GERD (gastroesophageal reflux disease)    Past Surgical History:  Procedure Laterality Date   gerd      Family History  Problem Relation Age of Onset   Cerebrovascular Accident Maternal Grandmother    Diabetes type II Other    Cancer Other    Social History   Socioeconomic History   Marital status: Widowed    Spouse name: Not on file   Number of children: 3   Years of education: Not on file   Highest education level: GED or equivalent   Occupational History   Occupation: Self employedMedical laboratory scientific officer  Tobacco Use   Smoking status: Former    Current packs/day: 1.00    Types: Cigarettes   Smokeless tobacco: Never  Vaping Use   Vaping status: Never Used  Substance and Sexual Activity   Alcohol use: No   Drug use: No   Sexual activity: Not Currently  Other Topics Concern   Not on file  Social History Narrative   Not on file   Social Drivers of Health   Financial Resource Strain: Patient Declined (07/16/2024)   Overall Financial Resource Strain (CARDIA)    Difficulty of Paying Living Expenses: Patient declined  Food Insecurity: No Food Insecurity (07/16/2024)   Hunger Vital Sign    Worried About Running Out of Food in the Last Year: Never true    Ran Out of Food in the Last Year: Never true  Transportation Needs: Unknown (07/16/2024)   PRAPARE - Administrator, Civil Service (Medical): No    Lack of Transportation (Non-Medical): Not on file  Physical Activity: Sufficiently Active (07/16/2024)   Exercise Vital Sign    Days of Exercise per Week: 7 days    Minutes of Exercise per Session: 90 min  Stress: No Stress Concern Present (07/16/2024)   [employer] of Occupational Health -  Occupational Stress Questionnaire    Feeling of Stress: Not at all  Social Connections: Moderately Integrated (07/16/2024)   Social Connection and Isolation Panel    Frequency of Communication with Friends and Family: More than three times a week    Frequency of Social Gatherings with Friends and Family: More than three times a week    Attends Religious Services: More than 4 times per year    Active Member of [employer] or Organizations: Yes    Attends Banker Meetings: More than 4 times per year    Marital Status: Widowed  CONSTITUTIONAL: Negative for chills, fatigue, fever, unintentional weight gain and unintentional weight loss.  E/N/T: Negative for ear pain, nasal congestion and sore throat.  CARDIOVASCULAR: Negative  for chest pain, dizziness, palpitations and pedal edema.  RESPIRATORY: Negative for recent cough and dyspnea.  GASTROINTESTINAL: Negative for abdominal pain, acid reflux symptoms, constipation, diarrhea, nausea and vomiting.  MSK: Negative for arthralgias and myalgias.  INTEGUMENTARY: Negative for rash.  NEUROLOGICAL: Negative for dizziness and headaches.  PSYCHIATRIC: Negative for sleep disturbance and to question depression screen.  Negative for depression, negative for anhedonia.       Objective:  PHYSICAL EXAM:   VS: BP 106/68   Pulse 66   Temp 98.3 F (36.8 C) (Temporal)   Resp 18   Ht 6' (1.829 m)   Wt 245 lb (111.1 kg)   SpO2 98%   BMI 33.23 kg/m   GEN: Well nourished, well developed, in no acute distress   Cardiac: RRR; no murmurs, rubs, or gallops,no edema - Respiratory:  normal respiratory rate and pattern with no distress - normal breath sounds with no rales, rhonchi, wheezes or rubs  MS: no deformity or atrophy  Skin: warm and dry, no rash  Neuro:  Alert and Oriented x 3,  - CN II-Xii grossly intact Psych: euthymic mood, appropriate affect and demeanor    Lab Results  Component Value Date   WBC 7.3 06/26/2023   HGB 14.1 06/26/2023   HCT 42.3 06/26/2023   PLT 277 06/26/2023   GLUCOSE 107 (H) 02/29/2024   CHOL 149 02/29/2024   TRIG 111 02/29/2024   HDL 39 (L) 02/29/2024   LDLCALC 90 02/29/2024   ALT 31 02/29/2024   AST 21 02/29/2024   NA 139 02/29/2024   K 4.5 02/29/2024   CL 102 02/29/2024   CREATININE 1.11 02/29/2024   BUN 16 02/29/2024   CO2 23 02/29/2024   TSH 1.960 09/21/2020   HGBA1C 7.0 (H) 02/29/2024   MICROALBUR 10 08/05/2021      Assessment & Plan:   Problem List Items Addressed This Visit       Digestive   Gastroesophageal reflux disease Continue med     Endocrine   Type 2 diabetes mellitus with hyperglycemia, without long-term current use of insulin (HCC) - Primary   Relevant Orders   CBC with Differential/Platelet    Comprehensive metabolic panel   Lipid panel   Hemoglobin A1c Continue to watch diet and take meds as directed           Mixed hyperlipidemia   Relevant Orders   Lipid panel Continue meds   Other Visit Diagnoses                .  No orders of the defined types were placed in this encounter.   Orders Placed This Encounter  Procedures   CBC with Differential/Platelet   Comprehensive metabolic panel with GFR   Lipid  panel   Hemoglobin A1c     Follow-up: Return in about 4 months (around 11/15/2024) for chronic fasting follow-up.  An After Visit Summary was printed and given to the patient.  CAMIE JONELLE NICHOLAUS DEVONNA [facility] [phone]

## 2024-07-17 ENCOUNTER — Other Ambulatory Visit: Payer: Self-pay | Admitting: Physician Assistant

## 2024-07-17 ENCOUNTER — Ambulatory Visit: Payer: Self-pay | Admitting: Physician Assistant

## 2024-07-17 LAB — CBC WITH DIFFERENTIAL/PLATELET
Basophils Absolute: 0 x10E3/uL (ref 0.0–0.2)
Basos: 1 %
EOS (ABSOLUTE): 0.2 x10E3/uL (ref 0.0–0.4)
Eos: 3 %
Hematocrit: 42.8 % (ref 37.5–51.0)
Hemoglobin: 13.3 g/dL (ref 13.0–17.7)
Immature Grans (Abs): 0 x10E3/uL (ref 0.0–0.1)
Immature Granulocytes: 0 %
Lymphocytes Absolute: 2.8 x10E3/uL (ref 0.7–3.1)
Lymphs: 47 %
MCH: 29 pg (ref 26.6–33.0)
MCHC: 31.1 g/dL — ABNORMAL LOW (ref 31.5–35.7)
MCV: 93 fL (ref 79–97)
Monocytes Absolute: 0.4 x10E3/uL (ref 0.1–0.9)
Monocytes: 8 %
Neutrophils Absolute: 2.4 x10E3/uL (ref 1.4–7.0)
Neutrophils: 41 %
Platelets: 245 x10E3/uL (ref 150–450)
RBC: 4.59 x10E6/uL (ref 4.14–5.80)
RDW: 12.7 % (ref 11.6–15.4)
WBC: 5.8 x10E3/uL (ref 3.4–10.8)

## 2024-07-17 LAB — COMPREHENSIVE METABOLIC PANEL WITH GFR
ALT: 29 IU/L (ref 0–44)
AST: 18 IU/L (ref 0–40)
Albumin: 4.5 g/dL (ref 3.8–4.9)
Alkaline Phosphatase: 43 IU/L — ABNORMAL LOW (ref 44–121)
BUN/Creatinine Ratio: 14 (ref 9–20)
BUN: 15 mg/dL (ref 6–24)
Bilirubin Total: 0.3 mg/dL (ref 0.0–1.2)
CO2: 24 mmol/L (ref 20–29)
Calcium: 9.1 mg/dL (ref 8.7–10.2)
Chloride: 100 mmol/L (ref 96–106)
Creatinine, Ser: 1.07 mg/dL (ref 0.76–1.27)
Globulin, Total: 2.2 g/dL (ref 1.5–4.5)
Glucose: 108 mg/dL — ABNORMAL HIGH (ref 70–99)
Potassium: 4.3 mmol/L (ref 3.5–5.2)
Sodium: 138 mmol/L (ref 134–144)
Total Protein: 6.7 g/dL (ref 6.0–8.5)
eGFR: 82 mL/min/1.73 (ref >59)

## 2024-07-17 LAB — LIPID PANEL
Chol/HDL Ratio: 4.3 ratio (ref 0.0–5.0)
Cholesterol, Total: 168 mg/dL (ref 100–199)
HDL: 39 mg/dL — ABNORMAL LOW (ref >39)
LDL Chol Calc (NIH): 108 mg/dL — ABNORMAL HIGH (ref 0–99)
Triglycerides: 114 mg/dL (ref 0–149)
VLDL Cholesterol Cal: 21 mg/dL (ref 5–40)

## 2024-07-17 LAB — HEMOGLOBIN A1C
Est. average glucose Bld gHb Est-mCnc: 143 mg/dL
Hgb A1c MFr Bld: 6.6 % — ABNORMAL HIGH (ref 4.8–5.6)

## 2024-07-31 ENCOUNTER — Other Ambulatory Visit: Payer: Self-pay | Admitting: Physician Assistant

## 2024-07-31 DIAGNOSIS — E1165 Type 2 diabetes mellitus with hyperglycemia: Secondary | ICD-10-CM

## 2024-09-23 ENCOUNTER — Other Ambulatory Visit: Payer: Self-pay

## 2024-09-23 MED ORDER — METFORMIN HCL 1000 MG PO TABS: 1000.0000 mg | ORAL_TABLET | Freq: Two times a day (BID) | ORAL | 0 refills | Status: AC

## 2024-10-18 ENCOUNTER — Other Ambulatory Visit: Payer: Self-pay | Admitting: Physician Assistant

## 2024-11-16 ENCOUNTER — Other Ambulatory Visit: Payer: Self-pay | Admitting: Physician Assistant

## 2024-11-16 DIAGNOSIS — E1165 Type 2 diabetes mellitus with hyperglycemia: Secondary | ICD-10-CM

## 2024-11-21 ENCOUNTER — Telehealth: Payer: Self-pay

## 2024-11-21 NOTE — Telephone Encounter (Signed)
 Copied from CRM [id number]. Topic: Clinical - Medication Question >> Nov 21, 2024  3:26 PM Montie POUR wrote: Reason for CRM:  He needs a refill on Semaglutide  (RYBELSUS ) 7 MG TABS. Please call him at [phone] to discuss when medication will be in the office to pick up. He goes through a program to help pay for medication.

## 2024-11-22 ENCOUNTER — Other Ambulatory Visit: Payer: Self-pay | Admitting: Physician Assistant

## 2024-11-22 MED ORDER — RYBELSUS 7 MG PO TABS: 30.0000 mg | ORAL_TABLET | Freq: Every day | ORAL | 1 refills | Status: DC

## 2024-11-22 NOTE — Telephone Encounter (Signed)
 Patient was in patient assistance program, but he ran out of Rybelsus  today. He said that he does not have insurance and I looked for samples, but we do have any. Please advice

## 2024-11-22 NOTE — Telephone Encounter (Signed)
 Is he no longer in patient assistance? - if he needs to reapply please forward to pharmacist We can call in rx to pharmacy - I will go ahead and fill

## 2024-11-25 ENCOUNTER — Other Ambulatory Visit (HOSPITAL_COMMUNITY): Payer: Self-pay

## 2024-11-25 ENCOUNTER — Other Ambulatory Visit: Payer: Self-pay | Admitting: Physician Assistant

## 2024-11-25 ENCOUNTER — Telehealth: Payer: Self-pay

## 2024-11-25 ENCOUNTER — Other Ambulatory Visit: Payer: Self-pay

## 2024-11-25 MED ORDER — RYBELSUS 7 MG PO TABS: ORAL_TABLET | ORAL | 1 refills | Status: DC

## 2024-11-25 NOTE — Telephone Encounter (Signed)
 Copied from CRM [id number]. Topic: Clinical - Prescription Issue >> Nov 22, 2024 12:24 PM Winona R wrote: Calling to clarify the instructions for pt rx Semaglutide  (RYBELSUS ) 7 MG TABS- Take 4.2857 tablets * Pharm would like to know how is the pt suppose to determine that amount

## 2024-11-25 NOTE — Telephone Encounter (Signed)
 Call pharmacy - have no idea how that value got put in ---- it is once daily and new rx was sent

## 2024-11-26 NOTE — Telephone Encounter (Signed)
 Please contact patient - rybelsus  is no longer under patient assistance Injectable GLP1s are and could look into getting one covered - please ask patient if he is willing to do weekly injection and if so will refer back to pharmacist to try to get

## 2024-11-26 NOTE — Telephone Encounter (Signed)
 Patient stated he is not willing to take injectable, made him aware that provider stated she will have to add a pill with medication he is already on, patient agrees with adding another pill. Stated that he has an appointment on 12/09/24 and he will discuss changes with the provider than, he is working on his diet right now and sugars have been down.

## 2024-12-09 ENCOUNTER — Ambulatory Visit: Payer: Self-pay | Admitting: Physician Assistant

## 2024-12-13 ENCOUNTER — Ambulatory Visit: Payer: Self-pay | Admitting: Physician Assistant

## 2024-12-13 ENCOUNTER — Encounter: Payer: Self-pay | Admitting: Physician Assistant

## 2024-12-13 VITALS — BP 100/62 | HR 86 | Temp 97.8°F | Resp 18 | Ht 72.0 in | Wt 234.6 lb

## 2024-12-13 DIAGNOSIS — E1165 Type 2 diabetes mellitus with hyperglycemia: Secondary | ICD-10-CM

## 2024-12-13 DIAGNOSIS — Z125 Encounter for screening for malignant neoplasm of prostate: Secondary | ICD-10-CM

## 2024-12-13 DIAGNOSIS — E782 Mixed hyperlipidemia: Secondary | ICD-10-CM

## 2024-12-13 NOTE — Progress Notes (Signed)
 "  Subjective:  Patient ID: John Valencia, male    DOB: 13-May-1971  Age: 54 y.o. MRN: [id number]  Chief Complaint  Patient presents with   Medical Management of Chronic Issues    HPI  Pt in today for follow up of diabetes.  He does not check sugars at all and has deferred seeing nutritionist.  Pt currently taking metformin  and actos .  He had been on rybelsus  but it is not on patient assistance anymore - will see how labs are and make recommendations for meds at that time Pt is up to date on eye exam He is taking altace  2.5mg  and pravachol  10mg  qd  Pt with history of GERD -symptoms stable on prevacid  30mg  qd  Current Outpatient Medications on File Prior to Visit  Medication Sig Dispense Refill   Aspirin-Acetaminophen -Caffeine (EXCEDRIN MIGRAINE PO) Take by mouth.     cyclobenzaprine  (FLEXERIL ) 5 MG tablet Take 1 tablet (5 mg total) by mouth 3 (three) times daily as needed for muscle spasms. 30 tablet 1   lansoprazole  (PREVACID ) 30 MG capsule TAKE 1 CAPSULE BY MOUTH DAILY 90 capsule 0   metFORMIN  (GLUCOPHAGE ) 1000 MG tablet Take 1 tablet (1,000 mg total) by mouth 2 (two) times daily with a meal. 180 tablet 0   pioglitazone  (ACTOS ) 45 MG tablet TAKE 1 TABLET BY MOUTH DAILY 90 tablet 0   pravastatin  (PRAVACHOL ) 10 MG tablet TAKE 1 TABLET BY MOUTH DAILY 90 tablet 0   ramipril  (ALTACE ) 2.5 MG capsule TAKE 1 CAPSULE BY MOUTH DAILY 90 capsule 0   No current facility-administered medications on file prior to visit.   Past Medical History:  Diagnosis Date   Asthma    GERD (gastroesophageal reflux disease)    Past Surgical History:  Procedure Laterality Date   gerd      Family History  Problem Relation Age of Onset   Cerebrovascular Accident Maternal Grandmother    Diabetes type II Other    Cancer Other    Social History   Socioeconomic History   Marital status: Widowed    Spouse name: Not on file   Number of children: 3   Years of education: Not on file   Highest education  level: GED or equivalent  Occupational History   Occupation: Self employedMedical Laboratory Scientific Officer  Tobacco Use   Smoking status: Former    Current packs/day: 1.00    Types: Cigarettes   Smokeless tobacco: Never  Vaping Use   Vaping status: Never Used  Substance and Sexual Activity   Alcohol use: No   Drug use: No   Sexual activity: Not Currently  Other Topics Concern   Not on file  Social History Narrative   Not on file   Social Drivers of Health   Tobacco Use: Medium Risk (12/13/2024)   Patient History    Smoking Tobacco Use: Former    Smokeless Tobacco Use: Never    Passive Exposure: Not on file  Financial Resource Strain: Patient Declined (07/16/2024)   Overall Financial Resource Strain (CARDIA)    Difficulty of Paying Living Expenses: Patient declined  Food Insecurity: No Food Insecurity (07/16/2024)   Epic    Worried About Programme Researcher, Broadcasting/film/video in the Last Year: Never true    Ran Out of Food in the Last Year: Never true  Transportation Needs: Unknown (07/16/2024)   Epic    Lack of Transportation (Medical): No    Lack of Transportation (Non-Medical): Not on file  Physical Activity: Sufficiently Active (07/16/2024)  Exercise Vital Sign    Days of Exercise per Week: 7 days    Minutes of Exercise per Session: 90 min  Stress: No Stress Concern Present (07/16/2024)   [employer] of Occupational Health - Occupational Stress Questionnaire    Feeling of Stress: Not at all  Social Connections: Moderately Integrated (07/16/2024)   Social Connection and Isolation Panel    Frequency of Communication with Friends and Family: More than three times a week    Frequency of Social Gatherings with Friends and Family: More than three times a week    Attends Religious Services: More than 4 times per year    Active Member of [employer] or Organizations: Yes    Attends Banker Meetings: More than 4 times per year    Marital Status: Widowed  Depression (PHQ2-9): Low Risk (12/13/2024)   Depression  (PHQ2-9)    PHQ-2 Score: 0  Alcohol Screen: Low Risk (07/16/2024)   Alcohol Screen    Last Alcohol Screening Score (AUDIT): 1  Housing: Unknown (07/16/2024)   Epic    Unable to Pay for Housing in the Last Year: No    Number of Times Moved in the Last Year: Not on file    Homeless in the Last Year: No  Utilities: Not At Risk (06/26/2023)   AHC Utilities    Threatened with loss of utilities: No  Health Literacy: Adequate Health Literacy (06/26/2023)   B1300 Health Literacy    Frequency of need for help with medical instructions: Never  CONSTITUTIONAL: Negative for chills, fatigue, fever, unintentional weight gain and unintentional weight loss.  CARDIOVASCULAR: Negative for chest pain, dizziness, palpitations and pedal edema.  RESPIRATORY: Negative for recent cough and dyspnea.  GASTROINTESTINAL: Negative for abdominal pain, acid reflux symptoms, constipation, diarrhea, nausea and vomiting.  PSYCHIATRIC: Negative for sleep disturbance and to question depression screen.  Negative for depression, negative for anhedonia.       Objective:  PHYSICAL EXAM:   VS: BP 100/62   Pulse 86   Temp 97.8 F (36.6 C) (Temporal)   Resp 18   Ht 6' (1.829 m)   Wt 234 lb 9.6 oz (106.4 kg)   SpO2 96%   BMI 31.82 kg/m   GEN: Well nourished, well developed, in no acute distress  Cardiac: RRR; no murmurs, Respiratory:  normal respiratory rate and pattern with no distress - normal breath sounds with no rales, rhonchi, wheezes or rubs Skin: warm and dry, no rash  Neuro:  Alert and Oriented x 3, - CN II-Xii grossly intact Psych: euthymic mood, appropriate affect and demeanor   Lab Results  Component Value Date   WBC 5.8 07/16/2024   HGB 13.3 07/16/2024   HCT 42.8 07/16/2024   PLT 245 07/16/2024   GLUCOSE 108 (H) 07/16/2024   CHOL 168 07/16/2024   TRIG 114 07/16/2024   HDL 39 (L) 07/16/2024   LDLCALC 108 (H) 07/16/2024   ALT 29 07/16/2024   AST 18 07/16/2024   NA 138 07/16/2024   K 4.3  07/16/2024   CL 100 07/16/2024   CREATININE 1.07 07/16/2024   BUN 15 07/16/2024   CO2 24 07/16/2024   TSH 1.960 09/21/2020   HGBA1C 6.6 (H) 07/16/2024   MICROALBUR 10 08/05/2021      Assessment & Plan:   Problem List Items Addressed This Visit       Digestive   Gastroesophageal reflux disease Continue med     Endocrine   Type 2 diabetes mellitus with hyperglycemia, without  long-term current use of insulin (HCC) - Primary   Relevant Orders   CBC with Differential/Platelet   Comprehensive metabolic panel   Lipid panel   Hemoglobin A1c Continue to watch diet and take meds as directed           Mixed hyperlipidemia   Relevant Orders   Lipid panel Continue meds   Other Visit Diagnoses                .  No orders of the defined types were placed in this encounter.   Orders Placed This Encounter  Procedures   Comprehensive metabolic panel with GFR   Lipid panel   Hemoglobin A1c   PSA     Follow-up: Return in about 6 months (around 06/12/2025) for chronic fasting follow-up.  An After Visit Summary was printed and given to the patient.  CAMIE JONELLE NICHOLAUS DEVONNA [facility] [phone] "

## 2025-06-16 ENCOUNTER — Ambulatory Visit: Payer: Self-pay | Admitting: Physician Assistant
